# Patient Record
Sex: Male | Born: 1970 | Race: White | Hispanic: No | Marital: Single | Smoking: Former smoker
Health system: Southern US, Community
[De-identification: ages and names within clinical notes are randomized; demographics above are authoritative.]

## PROBLEM LIST (undated history)

## (undated) DIAGNOSIS — E785 Hyperlipidemia, unspecified: Secondary | ICD-10-CM

## (undated) DIAGNOSIS — M109 Gout, unspecified: Secondary | ICD-10-CM

## (undated) DIAGNOSIS — T7840XA Allergy, unspecified, initial encounter: Secondary | ICD-10-CM

## (undated) DIAGNOSIS — J439 Emphysema, unspecified: Secondary | ICD-10-CM

## (undated) HISTORY — PX: CHOLECYSTECTOMY: SHX55

## (undated) HISTORY — DX: Allergy, unspecified, initial encounter: T78.40XA

---

## 2016-11-09 ENCOUNTER — Telehealth: Payer: Self-pay | Admitting: Family Medicine

## 2016-11-09 ENCOUNTER — Emergency Department (HOSPITAL_COMMUNITY)
Admission: EM | Admit: 2016-11-09 | Discharge: 2016-11-09 | Disposition: A | Discharge status: Home / Self Care | Payer: 59 | Attending: Emergency Medicine | Admitting: Emergency Medicine

## 2016-11-09 ENCOUNTER — Emergency Department (HOSPITAL_COMMUNITY): Payer: 59

## 2016-11-09 ENCOUNTER — Emergency Department (HOSPITAL_COMMUNITY)
Admission: EM | Admit: 2016-11-09 | Discharge: 2016-11-09 | Disposition: A | Discharge status: Home / Self Care | Payer: Self-pay | Attending: Emergency Medicine | Admitting: Emergency Medicine

## 2016-11-09 ENCOUNTER — Ambulatory Visit (INDEPENDENT_AMBULATORY_CARE_PROVIDER_SITE_OTHER): Payer: Worker's Compensation

## 2016-11-09 ENCOUNTER — Encounter (HOSPITAL_COMMUNITY): Payer: Self-pay | Admitting: Emergency Medicine

## 2016-11-09 ENCOUNTER — Ambulatory Visit (INDEPENDENT_AMBULATORY_CARE_PROVIDER_SITE_OTHER): Payer: Worker's Compensation | Admitting: Urgent Care

## 2016-11-09 ENCOUNTER — Encounter: Payer: Self-pay | Admitting: Urgent Care

## 2016-11-09 VITALS — BP 118/84 | HR 87 | Temp 98.0°F | Resp 16 | Ht 72.0 in | Wt 230.0 lb

## 2016-11-09 DIAGNOSIS — M25572 Pain in left ankle and joints of left foot: Secondary | ICD-10-CM

## 2016-11-09 DIAGNOSIS — M79672 Pain in left foot: Secondary | ICD-10-CM

## 2016-11-09 DIAGNOSIS — F17210 Nicotine dependence, cigarettes, uncomplicated: Secondary | ICD-10-CM | POA: Insufficient documentation

## 2016-11-09 DIAGNOSIS — Z026 Encounter for examination for insurance purposes: Secondary | ICD-10-CM

## 2016-11-09 DIAGNOSIS — Z5321 Procedure and treatment not carried out due to patient leaving prior to being seen by health care provider: Secondary | ICD-10-CM | POA: Insufficient documentation

## 2016-11-09 DIAGNOSIS — M109 Gout, unspecified: Secondary | ICD-10-CM | POA: Insufficient documentation

## 2016-11-09 DIAGNOSIS — W1849XA Other slipping, tripping and stumbling without falling, initial encounter: Secondary | ICD-10-CM | POA: Insufficient documentation

## 2016-11-09 DIAGNOSIS — S99912A Unspecified injury of left ankle, initial encounter: Secondary | ICD-10-CM

## 2016-11-09 HISTORY — DX: Gout, unspecified: M10.9

## 2016-11-09 MED ORDER — PREDNISONE 20 MG PO TABS
60.0000 mg | ORAL_TABLET | Freq: Once | ORAL | Status: AC
  Administered 2016-11-09: 60 mg via ORAL
  Filled 2016-11-09: qty 3

## 2016-11-09 MED ORDER — IBUPROFEN 200 MG PO TABS: 600.0000 mg | ORAL_TABLET | Freq: Once | ORAL | Status: DC

## 2016-11-09 MED ORDER — HYDROCODONE-ACETAMINOPHEN 5-325 MG PO TABS
1.0000 | ORAL_TABLET | Freq: Once | ORAL | Status: AC
  Administered 2016-11-09: 1 via ORAL
  Filled 2016-11-09: qty 1

## 2016-11-09 MED ORDER — HYDROCODONE-ACETAMINOPHEN 5-325 MG PO TABS: 1.0000 | ORAL_TABLET | Freq: Four times a day (QID) | ORAL | 0 refills | Status: DC | PRN

## 2016-11-09 MED ORDER — PREDNISONE 20 MG PO TABS: 40.0000 mg | ORAL_TABLET | Freq: Every day | ORAL | 0 refills | Status: AC

## 2016-11-09 NOTE — Telephone Encounter (Signed)
Smith - Pt had a W/C Initial visit today with Urban GibsonMani.  He was very unhappy about the celebrex proposal and asked to not be scheduled with Marquita PalmsMario again.  I set the next appt. With English, but he wants to discuss his visit with an MD asap. [phone]

## 2016-11-09 NOTE — Discharge Instructions (Signed)
Please read and follow all provided instructions.  Your diagnoses today include:  1. Acute gout of right ankle, unspecified cause     Tests performed today include: Vital signs. See below for your results today.   Medications prescribed:  Take as prescribed   Home care instructions:  Follow any educational materials contained in this packet.  Follow-up instructions: Please follow-up with your primary care provider for further evaluation of symptoms and treatment   Return instructions:  Please return to the Emergency Department if you do not get better, if you get worse, or new symptoms OR  - Fever (temperature greater than 101.61F)  - Bleeding that does not stop with holding pressure to the area    -Severe pain (please note that you may be more sore the day after your accident)  - Chest Pain  - Difficulty breathing  - Severe nausea or vomiting  - Inability to tolerate food and liquids  - Passing out  - Skin becoming red around your wounds  - Change in mental status (confusion or lethargy)  - New numbness or weakness    Please return if you have any other emergent concerns.  Additional Information:  Your vital signs today were: BP 135/83 (BP Location: Left Arm)    Pulse (!) 102    Temp 98.6 F (37 C) (Oral)    Resp 16    Ht 6' (1.829 m)    Wt 104.3 kg (230 lb)    SpO2 95%    BMI 31.19 kg/m  If your blood pressure (BP) was elevated above 135/85 this visit, please have this repeated by your doctor within one month. ---------------

## 2016-11-09 NOTE — ED Provider Notes (Signed)
[facility]-EMERGENCY DEPT Provider Note   CSN: [id number] Arrival date & time: 11/09/16  1943     History   Chief Complaint Chief Complaint  Patient presents with  . Foot Pain    HPI Miguel Frost is a 46 y.o. male.  HPI 46 y.o. male presents to the Emergency Department today due to left foot pain. Pt states that he slipped on an oil spill at work. Notes pain getting worse. Pt went to UC initially today with negative Xrays and lab work. Lab work was testing for gout. Results not back yet. Attempted OTC relief with Alleve and elevation for ankle sprain with minimal relief. Notes pain is similar in past with Gout flare. Rates pain 10/10. Worse with ROM. No numbness/tingling. No swelling. No erythema. No fevers. No other symptoms noted   Past Medical History:  Diagnosis Date  . Allergy   . Gout   . Gout     There are no active problems to display for this patient.   Past Surgical History:  Procedure Laterality Date  . CHOLECYSTECTOMY         Home Medications    Prior to Admission medications   Not on File    Family History Family History  Problem Relation Age of Onset  . Diabetes Mother   . Hyperlipidemia Mother   . Hypertension Mother   . Diabetes Father   . Hyperlipidemia Father   . Hypertension Father   . Heart disease Maternal Grandmother   . Stroke Paternal Grandfather   . Heart disease Paternal Grandfather     Social History Social History  Substance Use Topics  . Smoking status: Current Every Day Smoker    Packs/day: 1.00    Types: Cigarettes  . Smokeless tobacco: Never Used  . Alcohol use 0.6 oz/week    1 Standard drinks or equivalent per week     Comment: minimal     Allergies   Avelox [moxifloxacin] and Zithromax [azithromycin]   Review of Systems Review of Systems ROS reviewed and all are negative for acute change except as noted in the HPI.  Physical Exam Updated Vital Signs BP 135/83 (BP Location: Left Arm)   Pulse (!) 102    Temp 98.6 F (37 C) (Oral)   Resp 16   Ht 6' (1.829 m)   Wt 104.3 kg (230 lb)   SpO2 95%   BMI 31.19 kg/m   Physical Exam  Constitutional: He is oriented to person, place, and time. Vital signs are normal. He appears well-developed and well-nourished.  HENT:  Head: Normocephalic and atraumatic.  Right Ear: Hearing normal.  Left Ear: Hearing normal.  Eyes: Pupils are equal, round, and reactive to light. Conjunctivae and EOM are normal.  Neck: Normal range of motion. Neck supple.  Cardiovascular: Normal rate and regular rhythm.   Pulmonary/Chest: Effort normal.  Musculoskeletal: Normal range of motion.  Right Ankle  Mild swelling to lateral aspect of ankle. Extreme TTP just grazing ankle on lateral aspect. Mild swelling. Pain with ROM. NVI. Motor/sensation intact.  Neurological: He is alert and oriented to person, place, and time.  Skin: Skin is warm and dry.  Psychiatric: He has a normal mood and affect. His speech is normal and behavior is normal. Thought content normal.  Nursing note and vitals reviewed.  ED Treatments / Results  Labs (all labs ordered are listed, but only abnormal results are displayed) Labs Reviewed - No data to display  EKG  EKG Interpretation None  Radiology Dg Ankle Complete Left  Result Date: 11/09/2016 CLINICAL DATA:  Left ankle pain. EXAM: LEFT ANKLE COMPLETE - 3+ VIEW COMPARISON:  None. FINDINGS: There is a tiny bony fragment adjacent to the medial malleolus likely reflecting sequela prior avulsive injury. There is no acute fracture or dislocation. There is a plantar calcaneal spur. There is no evidence of arthropathy or other focal bone abnormality. Soft tissues are unremarkable. IMPRESSION: No acute osseous injury of the left ankle. Electronically Signed   By: Elige Ko   On: 11/09/2016 12:25   Dg Foot Complete Left  Result Date: 11/09/2016 CLINICAL DATA:  Left foot pain for a few days.  No injury. EXAM: LEFT FOOT - COMPLETE 3+ VIEW  COMPARISON:  None. FINDINGS: Left foot appears intact. No evidence of acute fracture or subluxation. No focal bone lesion or bone destruction. Bone cortex and trabecular architecture appear intact. Plantar calcaneal spur. No radiopaque soft tissue foreign bodies. IMPRESSION: No acute bony abnormalities. Electronically Signed   By: Burman Nieves M.D.   On: 11/09/2016 01:13    Procedures Procedures (including critical care time)  Medications Ordered in ED Medications - No data to display   Initial Impression / Assessment and Plan / ED Course  I have reviewed the triage vital signs and the nursing notes.  Pertinent labs & imaging results that were available during my care of the patient were reviewed by me and considered in my medical decision making (see chart for details).  Final Clinical Impressions(s) / ED Diagnoses     {I have reviewed the relevant previous healthcare records.  {I obtained HPI from historian.   ED Course:  Assessment: Pt is a 46 y.o. male  presents to the Emergency Department today due to left foot pain. Pt states that he slipped on an oil spill at work. Notes pain getting worse. Pt went to UC initially today with negative Xrays and lab work. Lab work was testing for gout. Results not back yet. Attempted OTC relief with Alleve and elevation for ankle sprain with minimal relief. Notes pain is similar in past with Gout flare. Rates pain 10/10. Worse with ROM. No numbness/tingling. No swelling. No erythema. No fevers. On exam, pt in NAD. Nontoxic/nonseptic appearing. VSS. Afebrile. Mild swelling to lateral aspect of ankle. Extreme TTP just grazing ankle on lateral aspect. Mild swelling. Pain with ROM. NVI. Motor/sensation intact. Symptoms consistent with Gout Flare. Discussed that pt should respond to treatment with in 24 hour of begining treatment & likely resolve in 2-3 days. Given prednisone and Norco in ED. I have reviewed the [place] Controlled Substance Reporting  System.  Given Rx Norco and Prednisone. Plan is to DC Home with follow up to PCP. At time of discharge, Patient is in no acute distress. Vital Signs are stable. Patient is able to ambulate. Patient able to tolerate PO.   Disposition/Plan:  DC Home Additional Verbal discharge instructions given and discussed with patient.  Pt Instructed to f/u with PCP in the next week for evaluation and treatment of symptoms. Return precautions given Pt acknowledges and agrees with plan  Supervising Physician Vanetta Mulders, MD  Final diagnoses:  Acute gout of right ankle, unspecified cause    New Prescriptions New Prescriptions   No medications on file     Wilber Bihari 11/09/16 2152    Vanetta Mulders, MD 11/09/16 2207

## 2016-11-09 NOTE — Progress Notes (Signed)
MRN: [id number] DOB: 04/01/1970  Subjective:   Miguel Frost is a 46 y.o. male presenting for worker's comp visit.   Reports suffering an ankle injury while at work 11/07/2016. Patient slipped on an oil spot with his left foot, did not roll his ankle but stopped his fall by loading a lot of his weight on his foot. He had immediate pain at the distal plantar surface of his foot which improved throughout the night. However, he has since had progressive pain, swelling, redness of the left ankle. Has tried Motrin, APAP, icing with minimal relief. He has elevated his foot as well. He did have an x-ray completed of his left foot yesterday which was negative for an acute process.   Miguel Frost's medications list, allergies, past medical history and past surgical history were reviewed and excluded from this note due to being a worker's comp case.   Objective:   Vitals: BP 118/84   Pulse 87   Temp 98 F (36.7 C) (Oral)   Resp 16   Ht 6' (1.829 m) Comment: per pt  Wt 230 lb (104.3 kg) Comment: per pt  SpO2 100%   BMI 31.19 kg/m   Physical Exam  Constitutional: He is oriented to person, place, and time. He appears well-developed and well-nourished.  Cardiovascular: Normal rate.   Pulmonary/Chest: Effort normal.  Musculoskeletal:       Left ankle: He exhibits decreased range of motion (dorsiflexion, plantar flexion, inversion of foot) and swelling (medially with associated erythema). He exhibits no ecchymosis, no deformity, no laceration and normal pulse. Tenderness. Medial malleolus tenderness found. No lateral malleolus, no AITFL, no CF ligament, no posterior TFL, no head of 5th metatarsal and no proximal fibula tenderness found. Achilles tendon exhibits no pain and no defect.       Left foot: There is normal range of motion, no tenderness, no bony tenderness, no swelling, normal capillary refill, no crepitus, no deformity and no laceration.  Neurological: He is alert and oriented to person,  place, and time.   Dg Ankle Complete Left  Result Date: 11/09/2016 CLINICAL DATA:  Left ankle pain. EXAM: LEFT ANKLE COMPLETE - 3+ VIEW COMPARISON:  None. FINDINGS: There is a tiny bony fragment adjacent to the medial malleolus likely reflecting sequela prior avulsive injury. There is no acute fracture or dislocation. There is a plantar calcaneal spur. There is no evidence of arthropathy or other focal bone abnormality. Soft tissues are unremarkable. IMPRESSION: No acute osseous injury of the left ankle. Electronically Signed   By: Elige KoHetal  Patel   On: 11/09/2016 12:25   Dg Foot Complete Left  Result Date: 11/09/2016 CLINICAL DATA:  Left foot pain for a few days.  No injury. EXAM: LEFT FOOT - COMPLETE 3+ VIEW COMPARISON:  None. FINDINGS: Left foot appears intact. No evidence of acute fracture or subluxation. No focal bone lesion or bone destruction. Bone cortex and trabecular architecture appear intact. Plantar calcaneal spur. No radiopaque soft tissue foreign bodies. IMPRESSION: No acute bony abnormalities. Electronically Signed   By: Burman NievesWilliam  Stevens M.D.   On: 11/09/2016 01:13   Assessment and Plan :   1. Encounter related to worker's compensation claim 2. Left ankle injury, initial encounter 3. Acute left ankle pain 4. Left foot pain - Patient would like to wait on lab results prior to starting NSAID therapy. Will use RICE method, work restrictions. RTC in 1 week for follow up.  Miguel BambergMario Kiely Cousar, PA-C [facility] at [facility] [phone] 11/09/2016 12:01  PM

## 2016-11-09 NOTE — ED Notes (Signed)
Pt reports L foot pain for past few days, no injury. Hurts on the ball of the foot.

## 2016-11-09 NOTE — ED Notes (Signed)
Patient states he would rather go to urgent care tomorrow if we are still busy in the morning. He states his foot is not as bad off as most of the patients in the waiting room. Ambulatory with steady gait.

## 2016-11-09 NOTE — Patient Instructions (Addendum)
Rest your left foot, ankle. Continue to ice 20 minutes every 2 hours for the next 24 hours. Use an Ace wrap around your left ankle. We will be in touch about results and prescribed either Celebrex or colchicine.    IF you received an x-ray today, you will receive an invoice from [facility]. Please contact [facility] at [phone] with questions or concerns regarding your invoice.   IF you received labwork today, you will receive an invoice from [facility]. Please contact [facility] at [phone] with questions or concerns regarding your invoice.   Our billing staff will not be able to assist you with questions regarding bills from these companies.  You will be contacted with the lab results as soon as they are available. The fastest way to get your results is to activate your My Chart account. Instructions are located on the last page of this paperwork. If you have not heard from us regarding the results in 2 weeks, please contact this office.

## 2016-11-09 NOTE — ED Triage Notes (Signed)
Pt states he slipped in a oil spill at work on Tuesday and injured his left foot  Pt states since the pain has gotten worse  Pt went to urgent care today and had xrays and blood work performed  States the xray was unremarkable and the blood work they did to test for gout was not back yet  Pt's left foot is red   Pt states most of the pain is up around the ankle

## 2016-11-10 ENCOUNTER — Telehealth: Payer: Self-pay | Admitting: *Deleted

## 2016-11-10 LAB — URIC ACID: Uric Acid: 7.6 mg/dL (ref 3.7–8.6)

## 2016-11-10 NOTE — Telephone Encounter (Signed)
Left message to return call 

## 2016-11-13 NOTE — Telephone Encounter (Signed)
PT tried to return your call.  His number is [phone]

## 2016-11-16 ENCOUNTER — Ambulatory Visit (INDEPENDENT_AMBULATORY_CARE_PROVIDER_SITE_OTHER): Payer: Worker's Compensation | Admitting: Physician Assistant

## 2016-11-16 ENCOUNTER — Encounter: Payer: Self-pay | Admitting: Physician Assistant

## 2016-11-16 VITALS — BP 120/83 | HR 63 | Temp 98.2°F | Resp 16 | Ht 72.0 in | Wt 231.0 lb

## 2016-11-16 DIAGNOSIS — S99912D Unspecified injury of left ankle, subsequent encounter: Secondary | ICD-10-CM | POA: Diagnosis not present

## 2016-11-16 DIAGNOSIS — Z09 Encounter for follow-up examination after completed treatment for conditions other than malignant neoplasm: Secondary | ICD-10-CM

## 2016-11-16 DIAGNOSIS — Z026 Encounter for examination for insurance purposes: Secondary | ICD-10-CM

## 2016-11-16 NOTE — Patient Instructions (Addendum)
Take the naproxen as needed.  Or your can take ibuprofen with food (600 mg every 6 hour), but not consistently past 24 hours.   Perform these stretches three times per day.  Pick three pictures.  You will then ice immediately after for about 15 minutes.   Ask your health care provider which exercises are safe for you. Do exercises exactly as told by your health care provider and adjust them as directed. It is normal to feel mild stretching, pulling, tightness, or discomfort as you do these exercises, but you should stop right away if you feel sudden pain or your pain gets worse.Do not begin these exercises until told by your health care provider. Stretching and range of motion exercises These exercises warm up your muscles and joints and improve the movement and flexibility of your lower leg and ankle. These exercises also help to relieve pain and stiffness. Exercise A: Gastroc and soleus stretch  1. Sit on the floor with your left / right leg extended. 2. Loop a belt or towel around the ball of your left / right foot. The ball of your foot is on the walking surface, right under your toes. 3. Keep your left / right ankle and foot relaxed and keep your knee straight while you use the belt or towel to pull your foot toward you. You should feel a gentle stretch behind your calf or knee. 4. Hold this position for __________ seconds, then release to the starting position. Repeat the exercise with your knee bent. You can put a pillow or a rolled bath towel under your knee to support it. You should feel a stretch deep in your calf or at your Achilles tendon. Repeat each stretch __________ times. Complete these stretches __________ times a day. Exercise B: Ankle alphabet  1. Sit with your left / right leg supported at the lower leg. ? Do not rest your foot on anything. ? Make sure your foot has room to move freely. 2. Think of your left / right foot as a paintbrush, and move your foot to trace each  letter of the alphabet in the air. Keep your hip and knee still while you trace. Make the letters as large as you can without feeling discomfort. 3. Trace every letter from A to Z. Repeat __________ times. Complete this exercise __________ times a day. Strengthening exercises These exercises build strength and endurance in your ankle and lower leg. Endurance is the ability to use your muscles for a long time, even after they get tired. Exercise C: Dorsiflexors  1. Secure a rubber exercise band or tube to an object, such as a table leg, that will stay still when the band is pulled. Secure the other end around your left / right foot. 2. Sit on the floor facing the object, with your left / right leg extended. The band or tube should be slightly tense when your foot is relaxed. 3. Slowly bring your foot toward you, pulling the band tighter. 4. Hold this position for __________ seconds. 5. Slowly return your foot to the starting position. Repeat __________ times. Complete this exercise __________ times a day. Exercise D: Plantar flexors  1. Sit on the floor with your left / right leg extended. 2. Loop a rubber exercise tube or band around the ball of your left / right foot. The ball of your foot is on the walking surface, right under your toes. ? Hold the ends of the band or tube in your hands. ? The  band or tube should be slightly tense when your foot is relaxed. 3. Slowly point your foot and toes downward, pushing them away from you. 4. Hold this position for __________ seconds. 5. Slowly return your foot to the starting position. Repeat __________ times. Complete this exercise __________ times a day. Exercise E: Evertors 1. Sit on the floor with your legs straight out in front of you. 2. Loop a rubber exercise band or tube around the ball of your left / right foot. The ball of your foot is on the walking surface, right under your toes. ? Hold the ends of the band in your hands, or secure the  band to a stable object. ? The band or tube should be slightly tense when your foot is relaxed. 3. Slowly push your foot outward, away from your other leg. 4. Hold this position for __________ seconds. 5. Slowly return your foot to the starting position. Repeat __________ times. Complete this exercise __________ times a day. This information is not intended to replace advice given to you by your health care provider. Make sure you discuss any questions you have with your health care provider. Document Released: 09/21/2004 Document Revised: 10/28/2015 Document Reviewed: 01/04/2015 Elsevier Interactive Patient Education  2018 [employer]     IF you received an x-ray today, you will receive an invoice from [facility]. Please contact [facility] at [phone] with questions or concerns regarding your invoice.   IF you received labwork today, you will receive an invoice from [facility]. Please contact [facility] at [phone] with questions or concerns regarding your invoice.   Our billing staff will not be able to assist you with questions regarding bills from these companies.  You will be contacted with the lab results as soon as they are available. The fastest way to get your results is to activate your My Chart account. Instructions are located on the last page of this paperwork. If you have not heard from us regarding the results in 2 weeks, please contact this office.

## 2016-11-16 NOTE — Progress Notes (Signed)
[facility] AT [facility] [address], [place] [phone]  Date:  11/16/2016   Name:  Miguel DuffJonathan Frost   DOB:  08/05/1970   MRN:  [id number]  PCP:  Patient, No Pcp Per    History of Present Illness:  Miguel Frost is a 46 y.o. male patient who presents to PCP with  Chief Complaint  Patient presents with  . Follow-up    ankle injury/ WC. pt feels better     Date of injury: 11/07/2016 Patient was seen 7 days ago for left ankle and foot pain after sliding in oil spilled at work.  He attempted to go to the ED initially, but after waiting long hours, came to [facility].  Diagnosed with left ankle pain and negative for acute changes.  Possibly gout.  Uric acid level obtained at 7.6  There are no active problems to display for this patient.   Past Medical History:  Diagnosis Date  . Allergy   . Gout   . Gout     Past Surgical History:  Procedure Laterality Date  . CHOLECYSTECTOMY      Allergies  Allergen Reactions  . Avelox [Moxifloxacin] Other (See Comments)    Severe headache  . Zithromax [Azithromycin] Rash    Medication list has been reviewed and updated.  Current Outpatient Prescriptions on File Prior to Visit  Medication Sig Dispense Refill  . HYDROcodone-acetaminophen (NORCO/VICODIN) 5-325 MG tablet Take 1 tablet by mouth every 6 (six) hours as needed. 10 tablet 0  . predniSONE (DELTASONE) 20 MG tablet Take 2 tablets (40 mg total) by mouth daily. (Patient not taking: Reported on 11/16/2016) 14 tablet 0   No current facility-administered medications on file prior to visit.     ROS ROS otherwise unremarkable unless listed above.  Physical Examination: BP 120/83   Pulse 63   Temp 98.2 F (36.8 C) (Oral)   Resp 16   Ht 6' (1.829 m)   Wt 231 lb (104.8 kg)   SpO2 96%   BMI 31.33 kg/m  Ideal Body Weight: Weight in (lb) to have BMI = 25: 183.9  Physical Exam  Constitutional: He is oriented to person, place, and time. He appears  well-developed and well-nourished. No distress.  HENT:  Head: Normocephalic and atraumatic.  Eyes: Pupils are equal, round, and reactive to light. Conjunctivae and EOM are normal.  Cardiovascular: Normal rate.   Pulmonary/Chest: Effort normal. No respiratory distress.  Musculoskeletal:  Right medial lower leg with some tenderness just proximal to the malleolus.  No erythema.  Appropriate rom and strength throughout lower extremity.  Normal pulses.  Neurological: He is alert and oriented to person, place, and time.  Skin: Skin is warm and dry. He is not diaphoretic.  Psychiatric: He has a normal mood and affect. His behavior is normal.     Assessment and Plan: Miguel Frost is a 46 y.o. male who is here today for follow up of right foot injury. This appears to be improving. I am placing light work restrictions for heavy ambulation (see note) Advised ice and rom stretches preceding. rtc in 5 days.  Left ankle injury, subsequent encounter  Encounter related to worker's compensation claim  Follow up  Trena PlattStephanie Sakoya Win, PA-C [facility] 9/14/20188:55 AM

## 2016-11-20 NOTE — Telephone Encounter (Signed)
Pt seen on 9/13

## 2016-11-21 ENCOUNTER — Ambulatory Visit (INDEPENDENT_AMBULATORY_CARE_PROVIDER_SITE_OTHER): Payer: Worker's Compensation | Admitting: Physician Assistant

## 2016-11-21 ENCOUNTER — Encounter: Payer: Self-pay | Admitting: Physician Assistant

## 2016-11-21 VITALS — BP 144/97 | HR 83 | Temp 98.7°F | Resp 17 | Ht 73.5 in | Wt 231.0 lb

## 2016-11-21 DIAGNOSIS — S99912D Unspecified injury of left ankle, subsequent encounter: Secondary | ICD-10-CM

## 2016-11-21 DIAGNOSIS — Z026 Encounter for examination for insurance purposes: Secondary | ICD-10-CM
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
## 2016-11-21 NOTE — Patient Instructions (Signed)
     IF you received an x-ray today, you will receive an invoice from [facility]. Please contact [facility] at [phone] with questions or concerns regarding your invoice.   IF you received labwork today, you will receive an invoice from [facility]. Please contact [facility] at [phone] with questions or concerns regarding your invoice.   Our billing staff will not be able to assist you with questions regarding bills from these companies.  You will be contacted with the lab results as soon as they are available. The fastest way to get your results is to activate your My Chart account. Instructions are located on the last page of this paperwork. If you have not heard from us regarding the results in 2 weeks, please contact this office.     

## 2017-01-11 NOTE — Telephone Encounter (Signed)
error 

## 2017-01-18 ENCOUNTER — Emergency Department (HOSPITAL_COMMUNITY)
Admission: EM | Admit: 2017-01-18 | Discharge: 2017-01-18 | Disposition: A | Discharge status: Home / Self Care | Payer: Self-pay | Attending: Emergency Medicine | Admitting: Emergency Medicine

## 2017-01-18 ENCOUNTER — Encounter (HOSPITAL_COMMUNITY): Payer: Self-pay | Admitting: Emergency Medicine

## 2017-01-18 ENCOUNTER — Emergency Department (HOSPITAL_COMMUNITY): Payer: Self-pay

## 2017-01-18 DIAGNOSIS — F17210 Nicotine dependence, cigarettes, uncomplicated: Secondary | ICD-10-CM | POA: Insufficient documentation

## 2017-01-18 DIAGNOSIS — Y929 Unspecified place or not applicable: Secondary | ICD-10-CM | POA: Insufficient documentation

## 2017-01-18 DIAGNOSIS — S8991XA Unspecified injury of right lower leg, initial encounter: Secondary | ICD-10-CM | POA: Insufficient documentation

## 2017-01-18 DIAGNOSIS — Y998 Other external cause status: Secondary | ICD-10-CM | POA: Insufficient documentation

## 2017-01-18 DIAGNOSIS — Y33XXXA Other specified events, undetermined intent, initial encounter: Secondary | ICD-10-CM | POA: Insufficient documentation

## 2017-01-18 DIAGNOSIS — Y9383 Activity, rough housing and horseplay: Secondary | ICD-10-CM | POA: Insufficient documentation

## 2017-01-18 MED ORDER — HYDROCODONE-ACETAMINOPHEN 5-325 MG PO TABS
1.0000 | ORAL_TABLET | Freq: Once | ORAL | Status: AC
  Administered 2017-01-18: 1 via ORAL
  Filled 2017-01-18: qty 1

## 2017-01-18 MED ORDER — ACETAMINOPHEN 325 MG PO TABS
650.0000 mg | ORAL_TABLET | Freq: Once | ORAL | Status: AC
  Administered 2017-01-18: 650 mg via ORAL
  Filled 2017-01-18: qty 2

## 2017-01-18 NOTE — Discharge Instructions (Signed)
There is small amount of fluid ("effusion") on your x-ray. This may be an inflammatory response to some soft tissue injury.  There were no bony abnormalities, fractures or dislocations.  Please continue taking your medications as prescribed by your primary care provider.  You can take 600 mg ibuprofen + 1000 mg tylenol every 8 hours. Use your vicodin as prescribed by your primary care doctor for break through pain. Note that vicodin is a controlled substance and opioid and any refills should be obtained by the original provider who prescribed them to you.  Continue wearing your brace. Schedule an appointment with orthopedist for further evaluation if you pain persists. You may need an MRI, follow up with your primary care provider for medication refills as needed.   EXAM:  RIGHT KNEE - COMPLETE 4+ VIEW     COMPARISON:  None in PACs     FINDINGS:  The bones are subjectively adequately mineralized. There is no acute  fracture nor dislocation. There is mild beaking of the tibial  spines. There is a small suprapatellar effusion. There are no  abnormal soft tissue calcifications.     IMPRESSION:  Mild degenerative spurring of the tibial spines. No acute fracture  nor dislocation.        Electronically Signed    By: David  SwazilandJordan M.D.    On: 01/18/2017 12:25

## 2017-01-18 NOTE — ED Triage Notes (Signed)
Patient reports that he was wrestling Saturday with buddy and hurt his right knee. Patient reports went to doctor on Monday due to pain and swelling, was told to wear brace and taking steroids. Patient reports that pain is still persistent and cant bend knee more than 90 degrees. Pain now radiating up and down right leg from knee.

## 2017-01-18 NOTE — ED Provider Notes (Signed)
[facility]-EMERGENCY DEPT Provider Note   CSN: [id number] Arrival date & time: 01/18/17  1135     History   Chief Complaint Chief Complaint  Patient presents with  . Knee Injury    HPI Miguel Frost is a 46 y.o. male presents to the ED for evaluation of gradually worsening right knee pain worse above the patella x 5 days. States he had been drinking alcohol and was wrestling his friend on the ground when he felt his right knee twist. Denies hearing pops or immediate pain. He went to see his doctor who gave his steroids, NSAIDs and knee brace. Pain initially improved but last night he felt his knee was stuck and he couldn't fully flex or extend it. Swelling has improved. Pain is worse with full extension, flexion and palpation above the patella. Alleviated slightly with NSAIDs and steroids. No previous h/o injuries or surgeries to knee. No fevers, chills, skin color changes, numbness or tingling distally. No h/o gout or IVDU.   HPI  Past Medical History:  Diagnosis Date  . Allergy   . Gout   . Gout     There are no active problems to display for this patient.   Past Surgical History:  Procedure Laterality Date  . CHOLECYSTECTOMY         Home Medications    Prior to Admission medications   Medication Sig Start Date End Date Taking? Authorizing Provider  HYDROcodone-acetaminophen (NORCO/VICODIN) 5-325 MG tablet Take 1 tablet by mouth every 6 (six) hours as needed. Patient not taking: Reported on 11/21/2016 11/09/16   Audry PiliMohr, Tyler, PA-C    Family History Family History  Problem Relation Age of Onset  . Diabetes Mother   . Hyperlipidemia Mother   . Hypertension Mother   . Diabetes Father   . Hyperlipidemia Father   . Hypertension Father   . Heart disease Maternal Grandmother   . Stroke Paternal Grandfather   . Heart disease Paternal Grandfather     Social History Social History   Tobacco Use  . Smoking status: Current Every Day Smoker    Packs/day: 1.00    Years: 27.00    Pack years: 27.00    Types: Cigarettes  . Smokeless tobacco: Never Used  Substance Use Topics  . Alcohol use: Yes    Alcohol/week: 0.6 oz    Types: 1 Standard drinks or equivalent per week    Comment: minimal  . Drug use: No     Allergies   Avelox [moxifloxacin] and Zithromax [azithromycin]   Review of Systems Review of Systems  Musculoskeletal: Positive for arthralgias.  All other systems reviewed and are negative.    Physical Exam Updated Vital Signs BP (!) 152/104 (BP Location: Right Arm)   Pulse 90   Temp 98.6 F (37 C) (Oral)   Resp 18   SpO2 98%   Physical Exam  Constitutional: He is oriented to person, place, and time. He appears well-developed and well-nourished. No distress.  NAD.  HENT:  Head: Normocephalic and atraumatic.  Right Ear: External ear normal.  Left Ear: External ear normal.  Nose: Nose normal.  Eyes: Conjunctivae and EOM are normal. No scleral icterus.  Neck: Normal range of motion.  Cardiovascular: Normal rate, regular rhythm, normal heart sounds and intact distal pulses.  Pulses:      Popliteal pulses are 2+ on the right side, and 2+ on the left side.       Dorsalis pedis pulses are 2+ on the right side,  and 2+ on the left side.  Pulmonary/Chest: Effort normal and breath sounds normal.  Musculoskeletal: Normal range of motion. He exhibits tenderness. He exhibits no deformity.       Right knee: Tenderness found.  Focal tenderness above right patella and popliteal fossa Decreased AROM of right knee due to pain.  No right knee edema, erythema or warmth.  No skin color changes to right knee.  No laxity.  No tenderness to medial/lateral joint lines or patellar tendon.  No obvious deformity of knees including edema, erythema or effusion.  Full passive ROM of knees bilaterally with normal patellar J tracking bilaterally.  No medial or lateral joint line tenderness.   No bony tenderness over patella,  fibular head or tibial tuberosity.   Negative Lachman's. Negative posterior drawer test.   No crepitus with knee ROM.   Neurological: He is alert and oriented to person, place, and time.  Skin: Skin is warm and dry. Capillary refill takes less than 2 seconds.  Psychiatric: He has a normal mood and affect. His behavior is normal. Judgment and thought content normal.  Nursing note and vitals reviewed.    ED Treatments / Results  Labs (all labs ordered are listed, but only abnormal results are displayed) Labs Reviewed - No data to display  EKG  EKG Interpretation None       Radiology Dg Knee Complete 4 Views Right  Result Date: 01/18/2017 CLINICAL DATA:  Twisting injury of the knee while wrestling with a friend 5 days ago. Increasing pain over the anterior aspect of the knee with decreased range of motion. EXAM: RIGHT KNEE - COMPLETE 4+ VIEW COMPARISON:  None in PACs FINDINGS: The bones are subjectively adequately mineralized. There is no acute fracture nor dislocation. There is mild beaking of the tibial spines. There is a small suprapatellar effusion. There are no abnormal soft tissue calcifications. IMPRESSION: Mild degenerative spurring of the tibial spines. No acute fracture nor dislocation. Electronically Signed   By: David  [place]azil[place]Jordan M.D.   On: 01/18/2017 12:25    Procedures Procedures (including critical care time)  Medications Ordered in ED Medications  HYDROcodone-acetaminophen (NORCO/VICODIN) 5-325 MG per tablet 1 tablet (not administered)  acetaminophen (TYLENOL) tablet 650 mg (not administered)     Initial Impression / Assessment and Plan / ED Course  I have reviewed the triage vital signs and the nursing notes.  Pertinent labs & imaging results that were available during my care of the patient were reviewed by me and considered in my medical decision making (see chart for details).    46 yo presents with right knee pain after mechanical injury. No evidence of  septic arthritis or gout on exam. Symptoms have slightly improved with steroids, NSAIDs and brace prescribed by his PCP who saw him for same 5 days ago. X-ray today shows small suprapatellar effusion, where pt has the most pain.  Suspect soft tissue injury. Will continue conservative tx and refer to ortho. He has vicodin rx by PCP. Will encourage NSAIDs and defer refill of vicodin in the ED today.   Final Clinical Impressions(s) / ED Diagnoses   Final diagnoses:  Injury of right knee, initial encounter    ED Discharge Orders    None       Liberty HandyGibbons, Chawn Spraggins J, PA-C 01/18/17 1257    Cardama, Amadeo GarnetPedro Eduardo, MD 01/23/17 2356

## 2017-06-13 ENCOUNTER — Encounter: Payer: Self-pay | Admitting: Physician Assistant

## 2017-07-26 ENCOUNTER — Encounter: Payer: Self-pay | Admitting: Family Medicine

## 2018-01-29 ENCOUNTER — Emergency Department (HOSPITAL_COMMUNITY): Payer: PRIVATE HEALTH INSURANCE

## 2018-01-29 ENCOUNTER — Encounter (HOSPITAL_COMMUNITY): Payer: Self-pay | Admitting: Emergency Medicine

## 2018-01-29 ENCOUNTER — Other Ambulatory Visit: Payer: Self-pay

## 2018-01-29 ENCOUNTER — Emergency Department (HOSPITAL_COMMUNITY)
Admission: EM | Admit: 2018-01-29 | Discharge: 2018-01-29 | Disposition: A | Discharge status: Home / Self Care | Payer: PRIVATE HEALTH INSURANCE | Attending: Emergency Medicine | Admitting: Emergency Medicine

## 2018-01-29 DIAGNOSIS — F17210 Nicotine dependence, cigarettes, uncomplicated: Secondary | ICD-10-CM | POA: Diagnosis not present

## 2018-01-29 DIAGNOSIS — R0789 Other chest pain: Secondary | ICD-10-CM | POA: Insufficient documentation

## 2018-01-29 DIAGNOSIS — R079 Chest pain, unspecified: Secondary | ICD-10-CM

## 2018-01-29 LAB — CBC WITH DIFFERENTIAL/PLATELET
Abs Immature Granulocytes: 0.03 10*3/uL (ref 0.00–0.07)
BASOS ABS: 0 10*3/uL (ref 0.0–0.1)
Basophils Relative: 1 %
EOS ABS: 0.3 10*3/uL (ref 0.0–0.5)
EOS PCT: 4 %
HEMATOCRIT: 46.3 % (ref 39.0–52.0)
HEMOGLOBIN: 16.1 g/dL (ref 13.0–17.0)
IMMATURE GRANULOCYTES: 0 %
LYMPHS ABS: 2.2 10*3/uL (ref 0.7–4.0)
LYMPHS PCT: 31 %
MCH: 31 pg (ref 26.0–34.0)
MCHC: 34.8 g/dL (ref 30.0–36.0)
MCV: 89.2 fL (ref 80.0–100.0)
Monocytes Absolute: 0.6 10*3/uL (ref 0.1–1.0)
Monocytes Relative: 8 %
Neutro Abs: 4 10*3/uL (ref 1.7–7.7)
Neutrophils Relative %: 56 %
Platelets: 217 10*3/uL (ref 150–400)
RBC: 5.19 MIL/uL (ref 4.22–5.81)
RDW: 12 % (ref 11.5–15.5)
WBC: 7.1 10*3/uL (ref 4.0–10.5)
nRBC: 0 % (ref 0.0–0.2)

## 2018-01-29 LAB — BASIC METABOLIC PANEL
ANION GAP: 9 (ref 5–15)
BUN: 15 mg/dL (ref 6–20)
CHLORIDE: 109 mmol/L (ref 98–111)
CO2: 25 mmol/L (ref 22–32)
Calcium: 9 mg/dL (ref 8.9–10.3)
Creatinine, Ser: 1.16 mg/dL (ref 0.61–1.24)
GFR calc non Af Amer: >60 mL/min (ref >60)
Glucose, Bld: 122 mg/dL — ABNORMAL HIGH (ref 70–99)
Potassium: 3.9 mmol/L (ref 3.5–5.1)
SODIUM: 143 mmol/L (ref 135–145)

## 2018-01-29 LAB — I-STAT TROPONIN, ED
TROPONIN I, POC: 0 ng/mL (ref 0.00–0.08)
TROPONIN I, POC: 0 ng/mL (ref 0.00–0.08)

## 2018-01-29 MED ORDER — IOPAMIDOL (ISOVUE-370) INJECTION 76%
100.0000 mL | Freq: Once | INTRAVENOUS | Status: AC | PRN
  Administered 2018-01-29: 100 mL via INTRAVENOUS

## 2018-01-29 MED ORDER — HYDROCODONE-ACETAMINOPHEN 5-325 MG PO TABS: 1.0000 | ORAL_TABLET | Freq: Four times a day (QID) | ORAL | 0 refills | Status: DC | PRN

## 2018-01-29 MED ORDER — SODIUM CHLORIDE (PF) 0.9 % IJ SOLN
INTRAMUSCULAR | Status: AC
  Filled 2018-01-29: qty 50

## 2018-01-29 MED ORDER — IOPAMIDOL (ISOVUE-370) INJECTION 76%
INTRAVENOUS | Status: AC
  Filled 2018-01-29: qty 100

## 2018-01-29 MED ORDER — DIPHENHYDRAMINE HCL 50 MG/ML IJ SOLN
25.0000 mg | Freq: Once | INTRAMUSCULAR | Status: AC
  Administered 2018-01-29: 25 mg via INTRAVENOUS
  Filled 2018-01-29: qty 1

## 2018-01-29 MED ORDER — MORPHINE SULFATE (PF) 4 MG/ML IV SOLN
4.0000 mg | Freq: Once | INTRAVENOUS | Status: AC
  Administered 2018-01-29: 4 mg via INTRAVENOUS
  Filled 2018-01-29: qty 1

## 2018-01-29 MED ORDER — KETOROLAC TROMETHAMINE 30 MG/ML IJ SOLN
30.0000 mg | Freq: Once | INTRAMUSCULAR | Status: AC
  Administered 2018-01-29: 30 mg via INTRAVENOUS
  Filled 2018-01-29: qty 1

## 2018-01-29 MED ORDER — NAPROXEN 500 MG PO TABS: 500.0000 mg | ORAL_TABLET | Freq: Two times a day (BID) | ORAL | 0 refills | Status: DC

## 2018-01-29 NOTE — ED Provider Notes (Signed)
[facility]-EMERGENCY DEPT Provider Note   CSN: [id number] Arrival date & time: 01/29/18  8657     History   Chief Complaint Chief Complaint  Patient presents with  . Chest Pain    HPI Zayaan Kozak is a 47 y.o. male.  HPI  This is a 47 year old male with a history of gout and smoking who presents with chest pain.  Patient reports that he awoke from sleep this morning at around 2 AM with acute onset anterior chest pain and back pain.  He states he thought he pulled a muscle.  He took 4 ibuprofen and went back to sleep.  He did not have any associated shortness of breath or nausea.  Patient subsequently was awoken again around 4 AM with similar pain.  Pain is not worse with ambulation.  He does have some worsening of pain in certain positions.  Denies any worsening of pain with food.  Has not noted any weakness or numbness in his bilateral upper or lower extremities.  No abdominal pain, nausea, vomiting.  Currently he rates his pain at 6 out of 10.  He states that it comes and goes.  Past Medical History:  Diagnosis Date  . Allergy   . Gout   . Gout     There are no active problems to display for this patient.   Past Surgical History:  Procedure Laterality Date  . CHOLECYSTECTOMY          Home Medications    Prior to Admission medications   Medication Sig Start Date End Date Taking? Authorizing Provider  HYDROcodone-acetaminophen (NORCO/VICODIN) 5-325 MG tablet Take 1 tablet by mouth every 6 (six) hours as needed. Patient not taking: Reported on 11/21/2016 11/09/16   Audry Pili, PA-C    Family History Family History  Problem Relation Age of Onset  . Diabetes Mother   . Hyperlipidemia Mother   . Hypertension Mother   . Diabetes Father   . Hyperlipidemia Father   . Hypertension Father   . Heart disease Maternal Grandmother   . Stroke Paternal Grandfather   . Heart disease Paternal Grandfather     Social History Social History    Tobacco Use  . Smoking status: Current Every Day Smoker    Packs/day: 1.00    Years: 27.00    Pack years: 27.00    Types: Cigarettes  . Smokeless tobacco: Never Used  Substance Use Topics  . Alcohol use: Yes    Alcohol/week: 1.0 standard drinks    Types: 1 Standard drinks or equivalent per week    Comment: minimal  . Drug use: No     Allergies   Avelox [moxifloxacin] and Zithromax [azithromycin]   Review of Systems Review of Systems  Constitutional: Negative for fever.  Respiratory: Negative for cough and shortness of breath.   Cardiovascular: Positive for chest pain.  Gastrointestinal: Negative for abdominal pain, nausea and vomiting.  Musculoskeletal: Positive for back pain.  Neurological: Negative for weakness and numbness.  All other systems reviewed and are negative.    Physical Exam Updated Vital Signs BP (!) 134/97 (BP Location: Right Arm)   Pulse 90   Temp 98.6 F (37 C) (Oral)   Resp 15   Ht 1.829 m (6')   Wt 106.6 kg   SpO2 98%   BMI 31.87 kg/m   Physical Exam  Constitutional: He is oriented to person, place, and time. He appears well-developed and well-nourished. No distress.  HENT:  Head: Normocephalic and atraumatic.  Eyes: Pupils are equal, round, and reactive to light.  Neck: Neck supple.  Cardiovascular: Normal rate, regular rhythm, normal heart sounds and normal pulses.  No murmur heard. Pulmonary/Chest: Effort normal and breath sounds normal. No respiratory distress. He has no wheezes.  Abdominal: Soft. Bowel sounds are normal. There is no tenderness. There is no rebound.  Musculoskeletal: He exhibits no edema.       Right lower leg: He exhibits no tenderness and no edema.       Left lower leg: He exhibits no tenderness and no edema.  Lymphadenopathy:    He has no cervical adenopathy.  Neurological: He is alert and oriented to person, place, and time.  Skin: Skin is warm and dry.  Psychiatric: He has a normal mood and affect.   Nursing note and vitals reviewed.    ED Treatments / Results  Labs (all labs ordered are listed, but only abnormal results are displayed) Labs Reviewed  CBC WITH DIFFERENTIAL/PLATELET  BASIC METABOLIC PANEL  CBC  I-STAT TROPONIN, ED  I-STAT TROPONIN, ED    EKG EKG Interpretation  Date/Time:  Tuesday January 29 2018 05:23:08 EST Ventricular Rate:  91 PR Interval:    QRS Duration: 96 QT Interval:  358 QTC Calculation: 441 R Axis:   54 Text Interpretation:  Sinus rhythm Baseline wander in lead(s) V2 V3 Confirmed by Ross Marcus,  ([id number]) on 01/29/2018 5:33:23 AM   Radiology No results found.  Procedures Procedures (including critical care time)  Medications Ordered in ED Medications - No data to display   Initial Impression / Assessment and Plan / ED Course  I have reviewed the triage vital signs and the nursing notes.  Pertinent labs & imaging results that were available during my care of the patient were reviewed by me and considered in my medical decision making (see chart for details).     Patient presents with chest pain.  He is overall nontoxic-appearing vital signs are reassuring.  Only risk factor for ACS is smoking.  EKG shows no signs of ischemia.  Chest x-ray is negative.  Given description, there is some concern for possible dissection.  Musculoskeletal would also be in the differential.  Patient was given morphine.  Dissection study obtained.  7:16 AM Was informed by CT tech the patient developed some eye itching and swelling.  On my exam he has no signs or symptoms of anaphylaxis.  Mild eye itching and swelling.  He was given Benadryl.  No oropharyngeal swelling or shortness of breath.  Patient signed out pending CT scan and repeat troponin.  Final Clinical Impressions(s) / ED Diagnoses   Final diagnoses:  None    ED Discharge Orders    None       , Mayer Maskerourtney F, MD 01/29/18 [phone]

## 2018-01-29 NOTE — ED Notes (Signed)
ED Provider at bedside. 

## 2018-01-29 NOTE — Discharge Instructions (Addendum)
Naproxen as prescribed.  Hydrocodone is prescribed as needed for pain not relieved with naproxen.  Follow-up with your primary doctor if symptoms are not improving in the next 3 to 4 days, and return to the ER if symptoms significantly worsen or change.

## 2018-01-29 NOTE — ED Notes (Signed)
Patient transported to CT 

## 2018-01-29 NOTE — ED Provider Notes (Signed)
Care assumed from Dr. Wilkie AyeHorton at shift change.  Patient presents here with complaints of chest pain.  Initial troponin and EKG do not suggest a cardiac etiology.  Care was signed out to me awaiting CT scan of the chest to rule out dissection.  This was performed and is negative.  A second troponin was obtained and this was also negative.  Patient feeling somewhat better with medications given in the ER.  Upon reevaluation, he continues to complain of discomfort, however this is worse when he sits forward and moves.  It seems very musculoskeletal in nature.  He will be discharged with pain medication and follow-up as needed.   Geoffery Lyonselo, Emberlynn Riggan, MD 01/29/18 [phone]

## 2018-01-29 NOTE — ED Triage Notes (Signed)
Pt reports chest pain that started at 0200 and is sharp pain in substernal chest with radiation to back.
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
## 2018-09-03 ENCOUNTER — Encounter: Payer: Self-pay | Admitting: Orthopaedic Surgery

## 2018-09-03 ENCOUNTER — Other Ambulatory Visit: Payer: Self-pay

## 2018-09-03 ENCOUNTER — Ambulatory Visit (INDEPENDENT_AMBULATORY_CARE_PROVIDER_SITE_OTHER): Payer: PRIVATE HEALTH INSURANCE | Admitting: Orthopaedic Surgery

## 2018-09-03 ENCOUNTER — Ambulatory Visit (INDEPENDENT_AMBULATORY_CARE_PROVIDER_SITE_OTHER): Payer: PRIVATE HEALTH INSURANCE

## 2018-09-03 DIAGNOSIS — M25562 Pain in left knee: Secondary | ICD-10-CM

## 2018-09-03 MED ORDER — INDOMETHACIN 25 MG PO CAPS: 75.0000 mg | ORAL_CAPSULE | Freq: Two times a day (BID) | ORAL | 0 refills | Status: DC

## 2018-09-03 MED ORDER — PREDNISONE 10 MG (21) PO TBPK: ORAL_TABLET | ORAL | 0 refills | Status: DC

## 2018-09-03 NOTE — Addendum Note (Signed)
Addended by: Mal Misty L on: 09/03/2018 01:25 PM   Modules accepted: Orders

## 2018-09-03 NOTE — Progress Notes (Signed)
Office Visit Note   Patient: Miguel DuffJonathan Frost           Date of Birth: 11/20/1970           MRN: [id number] Visit Date: 09/03/2018              Requested by: Kaleen MaskElkins, Wilson Oliver, MD [address] [place],  [place] [id number] PCP: Kaleen MaskElkins, Wilson Oliver, MD   Assessment & Plan: Visit Diagnoses:  1. 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  Patient declined cortisone injection I would like to try prednisone Dosepak and indomethacin for now.  Rest and ice and elevate.  Out of work for couple days.  Patient will call if he has no better or if he changes his mind about the injection.  Uric acid level obtained today.  Follow-Up Instructions: Return if symptoms worsen or fail to improve.   Orders:  Orders Placed This Encounter  Procedures  . XR KNEE 3 VIEW LEFT   Meds ordered this encounter  Medications  . predniSONE (STERAPRED UNI-PAK 21 TAB) 10 MG (21) TBPK tablet    Sig: Take as directed    Dispense:  21 tablet    Refill:  0  . indomethacin (INDOCIN) 25 MG capsule    Sig: Take 3 capsules (75 mg total) by mouth 2 (two) times daily with a meal.    Dispense:  60 capsule    Refill:  0      Procedures: No procedures performed   Clinical Data: No additional findings.   Subjective: Chief Complaint  Patient presents with  . Left Knee - Pain    Christiane Frost is a 48 year old gentleman who comes in for acute onset of left knee pain since last night.  He does have a strong history of gout but has never had it in the left knee.  Denies any injuries.  Denies any mechanical symptoms.  Denies any constitutional symptoms.  Denies any numbness and tingling.  The pain is centered around the knee with movement.  Denies any ankle or hip pain.  He takes allopurinol and he took colchicine last night.   Review of Systems  Constitutional: Negative.   All other systems reviewed and are negative.    Objective: Vital  Signs: There were no vitals taken for this visit.  Physical Exam Vitals signs and nursing note reviewed.  Constitutional:      Appearance: He is well-developed.  HENT:     Head: Normocephalic and atraumatic.  Eyes:     Pupils: Pupils are equal, round, and reactive to light.  Neck:     Musculoskeletal: Neck supple.  Pulmonary:     Effort: Pulmonary effort is normal.  Abdominal:     Palpations: Abdomen is soft.  Musculoskeletal: Normal range of motion.  Skin:    General: Skin is warm.  Neurological:     Mental Status: He is alert and oriented to person, place, and time.  Psychiatric:        Behavior: Behavior normal.        Thought Content: Thought content normal.        Judgment: Judgment normal.     Ortho Exam Left knee exam shows a trace joint effusion.  No redness or warmth or signs of infection.  Painful range of motion.  Otherwise exam is unremarkable. Specialty Comments:  No specialty comments available.  Imaging: Xr Knee 3 View Left  Result  Date: 09/03/2018 No acute or structural abnormalities    PMFS History: There are no active problems to display for this patient.  Past Medical History:  Diagnosis Date  . Allergy   . Gout   . Gout     Family History  Problem Relation Age of Onset  . Diabetes Mother   . Hyperlipidemia Mother   . Hypertension Mother   . Diabetes Father   . Hyperlipidemia Father   . Hypertension Father   . Heart disease Maternal Grandmother   . Stroke Paternal Grandfather   . Heart disease Paternal Grandfather     Past Surgical History:  Procedure Laterality Date  . CHOLECYSTECTOMY     Social History   Occupational History  . Not on file  Tobacco Use  . Smoking status: Current Every Day Smoker    Packs/day: 1.00    Years: 27.00    Pack years: 27.00    Types: Cigarettes  . Smokeless tobacco: Never Used  Substance and Sexual Activity  . Alcohol use: Yes    Alcohol/week: 1.0 standard drinks    Types: 1 Standard drinks  or equivalent per week    Comment: minimal  . Drug use: No  . Sexual activity: Not on file

## 2018-09-04 LAB — URIC ACID: Uric Acid, Serum: 2.9 mg/dL — ABNORMAL LOW (ref 4.0–8.0)

## 2018-10-10 ENCOUNTER — Ambulatory Visit (INDEPENDENT_AMBULATORY_CARE_PROVIDER_SITE_OTHER): Payer: PRIVATE HEALTH INSURANCE

## 2018-10-10 ENCOUNTER — Ambulatory Visit (INDEPENDENT_AMBULATORY_CARE_PROVIDER_SITE_OTHER): Payer: PRIVATE HEALTH INSURANCE | Admitting: Orthopaedic Surgery

## 2018-10-10 ENCOUNTER — Encounter: Payer: Self-pay | Admitting: Orthopaedic Surgery

## 2018-10-10 DIAGNOSIS — M25561 Pain in right knee: Secondary | ICD-10-CM | POA: Diagnosis not present

## 2018-10-10 MED ORDER — METHYLPREDNISOLONE ACETATE 40 MG/ML IJ SUSP
40.0000 mg | INTRAMUSCULAR | Status: AC | PRN
  Administered 2018-10-10: 11:00:00 40 mg via INTRA_ARTICULAR

## 2018-10-10 MED ORDER — BUPIVACAINE HCL 0.25 % IJ SOLN
2.0000 mL | INTRAMUSCULAR | Status: AC | PRN
  Administered 2018-10-10: 11:00:00 2 mL via INTRA_ARTICULAR

## 2018-10-10 MED ORDER — LIDOCAINE HCL 1 % IJ SOLN
2.0000 mL | INTRAMUSCULAR | Status: AC | PRN
  Administered 2018-10-10: 2 mL

## 2018-10-10 NOTE — Progress Notes (Signed)
Office Visit Note   Patient: Miguel Frost           Date of Birth: Nov 07, 1970           MRN: [id number] Visit Date: 10/10/2018              Requested by: Miguel Downing, MD [place],  [place] [address] PCP: Miguel Downing, MD   Assessment & Plan: Visit Diagnoses:  1. Acute pain of right knee     Plan: Impression is right knee questionable gout attack.  Approximately 24 cc fluid was aspirated from the right knee.  This was sent off for cell count and crystals.  We will call him with the results.  I then injected this with cortisone.  Patient will touch base with [place] in 2 weeks to let us know how he is doing.  If he has is worsening symptoms over the weekend or next week he will call us and let us know.  Follow-Up Instructions: Return if symptoms worsen or fail to improve.   Orders:  Orders Placed This Encounter  Procedures  . Large Joint Inj: R knee  . XR Knee Complete 4 Views Right   No orders of the defined types were placed in this encounter.     Procedures: Large Joint Inj: R knee on 10/10/2018 11:10 AM Indications: pain Details: 22 G needle, anterolateral approach Medications: 2 mL bupivacaine 0.25 %; 2 mL lidocaine 1 %; 40 mg methylPREDNISolone acetate 40 MG/ML      Clinical Data: No additional findings.   Subjective: Chief Complaint  Patient presents with  . Right Knee - Pain    HPI patient is a pleasant 48 year old gentleman who presents our clinic today with right knee pain.  This began this past Monday afternoon.  No new injury or change in activity.  He does note an injury to the medial right knee in 2018 which improved with time.  No pain since then until Monday.  The pain he has is primarily to the medial aspect.  He denies any locking or catching but notes that he feels a lot of pressure and is unable to move his knee secondary to the pain.  He has noticed swelling and redness as well.  He has been taking Norco with mild  to no relief of symptoms.  He does have a history of 4 gouty attacks over the past 2 months.  On Monday, he also had gout attack to his right foot for which he took Colcrys.  This resolved.  No change in symptoms to the right knee.  He was seen in our office a few weeks ago with similar symptoms to the left knee.  Uric acid levels obtained which were below normal.  He was given a Sterapred taper which seemed to alleviated his symptoms.  Review of Systems as detailed in HPI.  All others reviewed and are negative.   Objective: Vital Signs: There were no vitals taken for this visit.  Physical Exam well-developed well-nourished gentleman in no acute distress.  Alert and oriented x3.  Ortho Exam examination of the right knee shows a trace to 1+ effusion.  Very limited range of motion secondary to pain.  He has mild erythema.  Moderate medial joint line tenderness.  Calf is soft nontender.  He is neurovascular intact distally.  Specialty Comments:  No specialty comments available.  Imaging: Xr Knee Complete 4 Views Right  Result Date: 10/10/2018 Mild medial and patellofemoral joint  space narrowing.  Otherwise, no acute or structural abnormality    PMFS History: There are no active problems to display for this patient.  Past Medical History:  Diagnosis Date  . Allergy   . Gout   . Gout     Family History  Problem Relation Age of Onset  . Diabetes Mother   . Hyperlipidemia Mother   . Hypertension Mother   . Diabetes Father   . Hyperlipidemia Father   . Hypertension Father   . Heart disease Maternal Grandmother   . Stroke Paternal Grandfather   . Heart disease Paternal Grandfather     Past Surgical History:  Procedure Laterality Date  . CHOLECYSTECTOMY     Social History   Occupational History  . Not on file  Tobacco Use  . Smoking status: Current Every Day Smoker    Packs/day: 1.00    Years: 27.00    Pack years: 27.00    Types: Cigarettes  . Smokeless tobacco: Never  Used  Substance and Sexual Activity  . Alcohol use: Yes    Alcohol/week: 1.0 standard drinks    Types: 1 Standard drinks or equivalent per week    Comment: minimal  . Drug use: No  . Sexual activity: Not on file

## 2018-10-11 LAB — SYNOVIAL CELL COUNT + DIFF, W/ CRYSTALS
Basophils, %: 0 %
Eosinophils-Synovial: 0 % (ref 0–2)
Lymphocytes-Synovial Fld: 53 % (ref 0–74)
Monocyte/Macrophage: 11 % (ref 0–69)
Neutrophil, Synovial: 36 % — ABNORMAL HIGH (ref 0–24)
Synoviocytes, %: 0 % (ref 0–15)
WBC, Synovial: 8707 cells/uL — ABNORMAL HIGH (ref <150)

## 2018-10-16 ENCOUNTER — Encounter: Payer: Self-pay | Admitting: Orthopaedic Surgery

## 2018-10-16 MED ORDER — PREDNISONE 5 MG (21) PO TBPK: ORAL_TABLET | ORAL | 0 refills | Status: DC

## 2018-10-16 NOTE — Telephone Encounter (Signed)
Ok do you mind sending a pred pak in please.  Thanks.

## 2018-10-16 NOTE — Telephone Encounter (Signed)
Let's get him a hinged knee brace to help with ambulation.  What prescription was she supposed to call in?

## 2018-10-17 ENCOUNTER — Encounter: Payer: Self-pay | Admitting: Orthopaedic Surgery

## 2019-11-03 IMAGING — CR DG CHEST 2V
2 series · 2 of 2 positions shown · non-contrast
Comparison: None.

CLINICAL DATA: Sudden onset of substernal chest pain.

EXAM:
CHEST - 2 VIEW

[w chest lat]
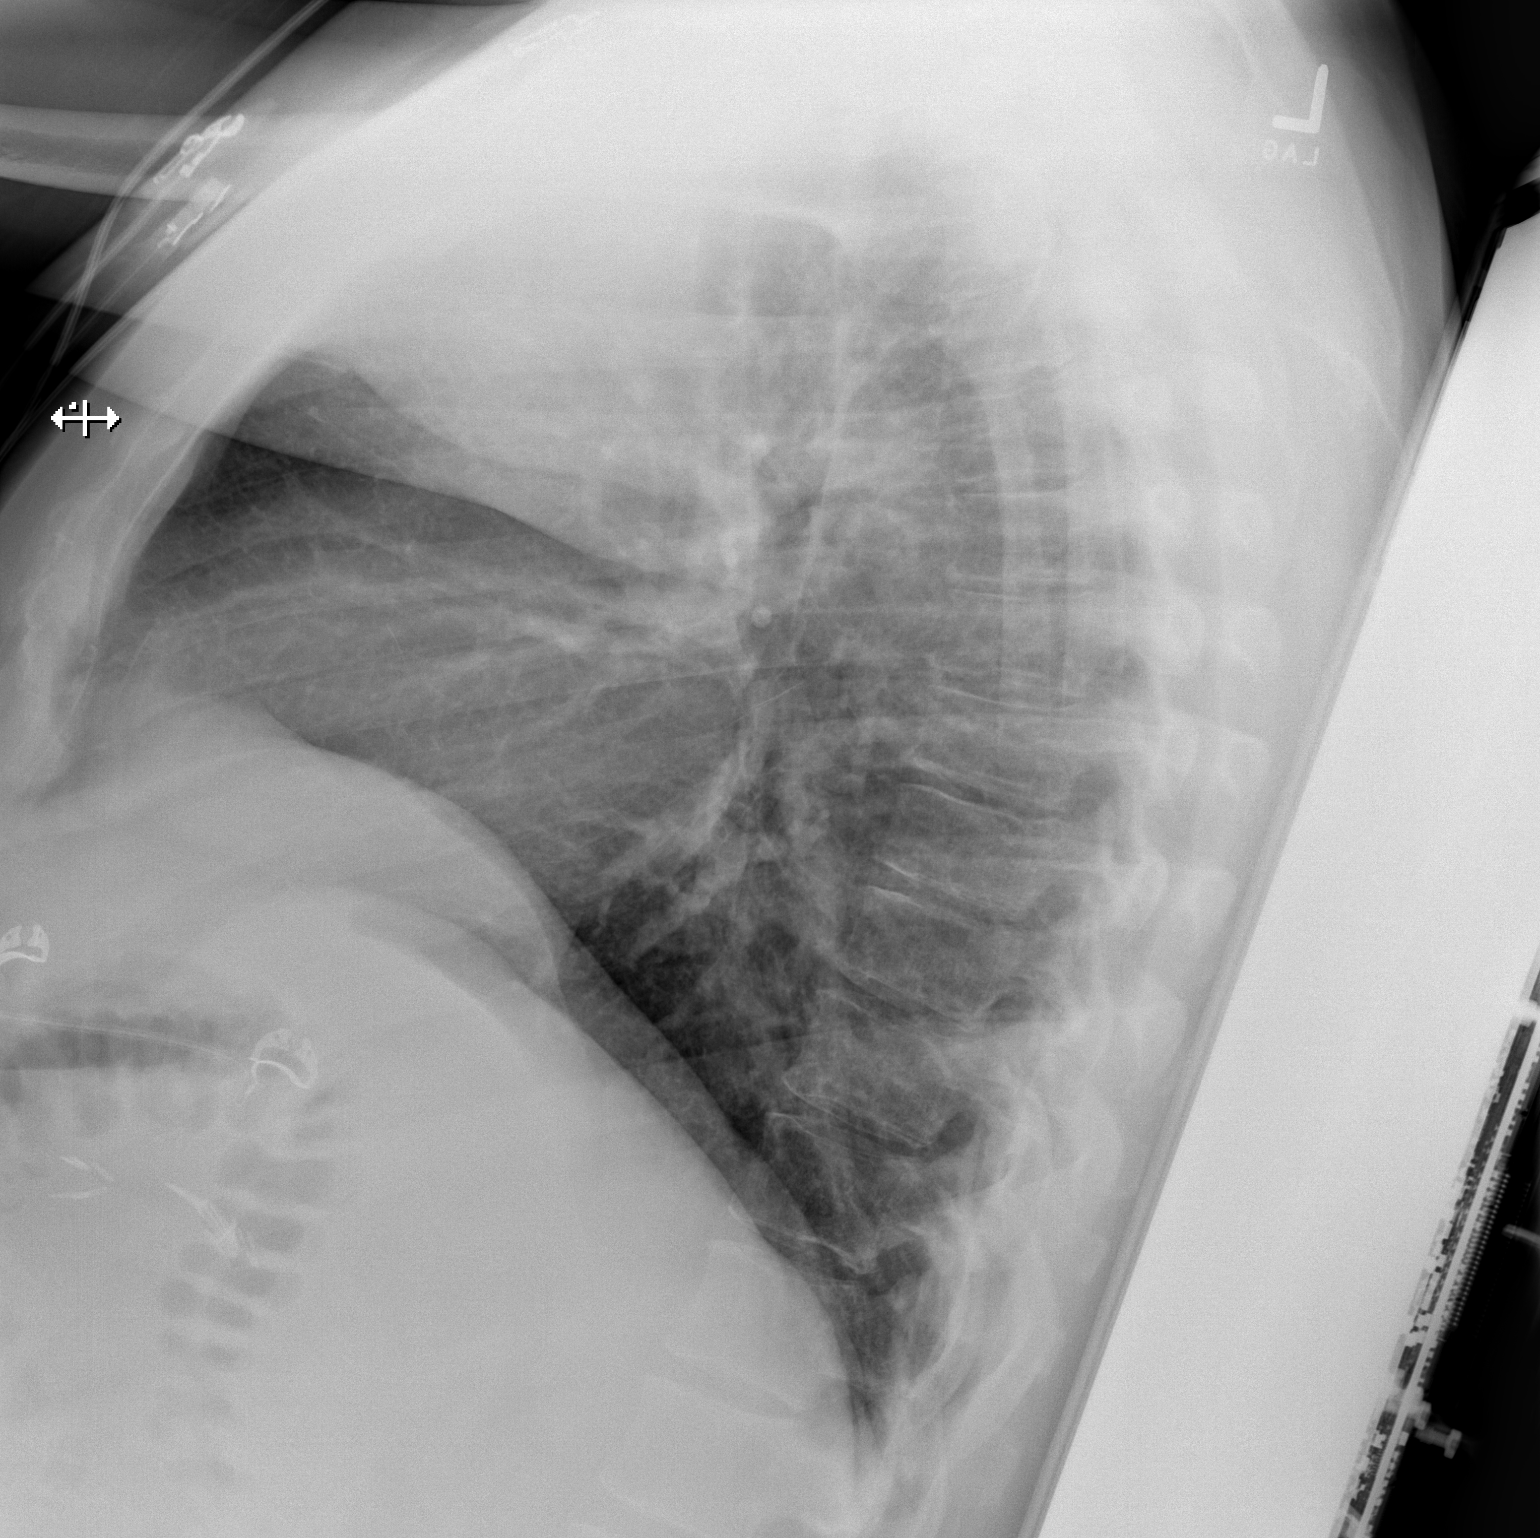

[x chest ap]
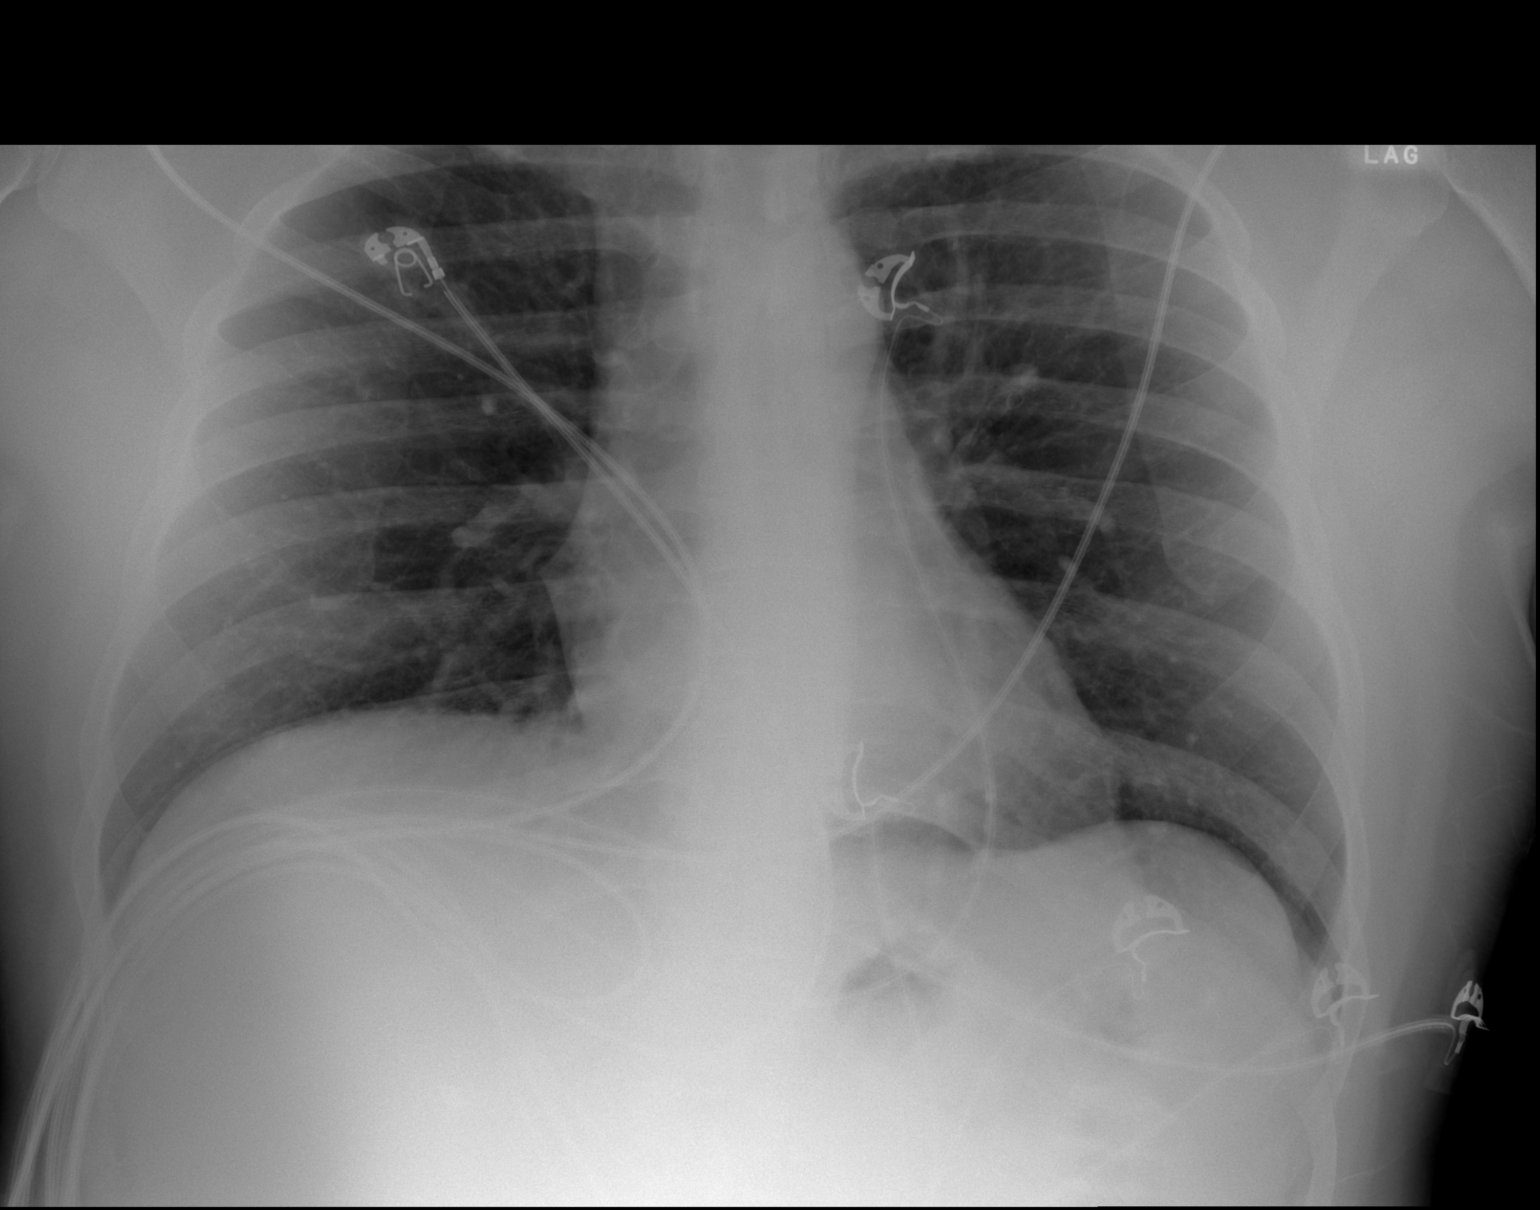

[2 of 2 positions shown; findings below may reference images not displayed]

FINDINGS: The cardiomediastinal contours are normal. The lungs are clear.
Pulmonary vasculature is normal. No consolidation, pleural effusion,
or pneumothorax. No acute osseous abnormalities are seen.
IMPRESSION: Unremarkable radiographs of the chest.

## 2020-03-29 ENCOUNTER — Other Ambulatory Visit: Payer: Self-pay | Admitting: Orthopedic Surgery

## 2020-03-29 DIAGNOSIS — M25831 Other specified joint disorders, right wrist: Secondary | ICD-10-CM

## 2020-03-30 ENCOUNTER — Other Ambulatory Visit: Payer: Self-pay

## 2020-03-30 ENCOUNTER — Telehealth: Payer: Self-pay

## 2020-03-30 MED ORDER — PREDNISONE 50 MG PO TABS: ORAL_TABLET | ORAL | 0 refills | Status: DC

## 2020-03-30 MED ORDER — DIPHENHYDRAMINE HCL 50 MG PO CAPS: 50.0000 mg | ORAL_CAPSULE | Freq: Once | ORAL | Status: AC

## 2020-03-30 NOTE — Telephone Encounter (Signed)
13 hour prep ordered for contrast allergy

## 2020-04-02 ENCOUNTER — Other Ambulatory Visit: Payer: Self-pay

## 2020-04-02 ENCOUNTER — Ambulatory Visit
Admission: RE | Admit: 2020-04-02 | Discharge: 2020-04-02 | Disposition: A | Discharge status: Home / Self Care | Payer: BLUE CROSS/BLUE SHIELD | Source: Ambulatory Visit | Attending: Orthopedic Surgery | Admitting: Orthopedic Surgery

## 2020-04-02 ENCOUNTER — Ambulatory Visit
Admission: RE | Admit: 2020-04-02 | Discharge: 2020-04-02 | Disposition: A | Discharge status: Home / Self Care | Payer: PRIVATE HEALTH INSURANCE | Source: Ambulatory Visit | Attending: Orthopedic Surgery | Admitting: Orthopedic Surgery

## 2020-04-02 DIAGNOSIS — M25831 Other specified joint disorders, right wrist: Secondary | ICD-10-CM

## 2020-04-02 MED ORDER — IOPAMIDOL (ISOVUE-M 200) INJECTION 41%
3.0000 mL | Freq: Once | INTRAMUSCULAR | Status: AC
  Administered 2020-04-02: 3 mL via INTRA_ARTICULAR

## 2020-05-17 ENCOUNTER — Other Ambulatory Visit: Payer: Self-pay | Admitting: Orthopedic Surgery

## 2020-05-17 DIAGNOSIS — M25831 Other specified joint disorders, right wrist: Secondary | ICD-10-CM

## 2020-05-27 ENCOUNTER — Other Ambulatory Visit: Payer: Self-pay | Admitting: Family Medicine

## 2020-05-27 DIAGNOSIS — F17200 Nicotine dependence, unspecified, uncomplicated: Secondary | ICD-10-CM

## 2020-06-09 ENCOUNTER — Other Ambulatory Visit: Payer: BLUE CROSS/BLUE SHIELD

## 2020-06-22 ENCOUNTER — Ambulatory Visit
Admission: RE | Admit: 2020-06-22 | Discharge: 2020-06-22 | Disposition: A | Discharge status: Home / Self Care | Payer: BLUE CROSS/BLUE SHIELD | Source: Ambulatory Visit | Attending: Family Medicine | Admitting: Family Medicine

## 2020-06-22 DIAGNOSIS — F17200 Nicotine dependence, unspecified, uncomplicated: Secondary | ICD-10-CM

## 2020-09-07 ENCOUNTER — Other Ambulatory Visit: Payer: Self-pay

## 2020-09-07 ENCOUNTER — Ambulatory Visit
Admission: EM | Admit: 2020-09-07 | Discharge: 2020-09-07 | Disposition: A | Discharge status: Home / Self Care | Payer: BLUE CROSS/BLUE SHIELD

## 2020-09-07 DIAGNOSIS — M5442 Lumbago with sciatica, left side: Secondary | ICD-10-CM

## 2020-09-07 DIAGNOSIS — M5441 Lumbago with sciatica, right side: Secondary | ICD-10-CM

## 2020-09-07 MED ORDER — PREDNISONE 20 MG PO TABS: 40.0000 mg | ORAL_TABLET | Freq: Every day | ORAL | 0 refills | Status: AC

## 2020-09-07 MED ORDER — KETOROLAC TROMETHAMINE 30 MG/ML IJ SOLN
30.0000 mg | Freq: Once | INTRAMUSCULAR | Status: AC
  Administered 2020-09-07: 30 mg via INTRAMUSCULAR

## 2020-09-07 NOTE — ED Triage Notes (Signed)
Dx with covid 2 weeks ago, notes he spent the majority of his time lying down causing a gradual onset of low back pain. (Over 1 week since the onset) Lumbar pain that radiates into the posterior aspect of his bilateral thighs and legs stopping before his ankles. Confirms intermittent numbness and tingling with the same distribution. Sxs have worsened since the onset. Lying down alleviates sxs. Standing, walking and bending aggravate sxs. Has been taking Motrin without relief.

## 2020-09-07 NOTE — ED Provider Notes (Signed)
[name]    CSN: [id number] Arrival date & time: 09/07/20  1112      History   Chief Complaint Chief Complaint  Patient presents with   Back Pain    Lumbar radiates to posterior  BLE.[name]    HPI Miguel Frost is a 50 y.o. male.   Patient presents to the urgent care with approximately 9-day history of bilateral lower back pain.  Patient reports that pain started after he had to lay in the bed for 5 days due to COVID diagnosis.  Denies any injury to back.  Pain does radiate down bilateral legs.  Has intermittent numbness and tingling.  Denies any dysuria, urinary frequency, urinary or bowel incontinence.  Patient states that he does have a history of sciatica approximately 10 years ago but no flareups since then.  Patient is prescribed Celebrex for right wrist pain that he is being managed and evaluated by other provider.  Patient stopped taking Celebrex approximately 1 week ago in order to take ibuprofen over-the-counter.  Has been taking ibuprofen with no relief of pain.  Has not used ice or heat application.  Patient rates pain 8 out of 10 on a 1-10 pain scale when moving.  Patient rates pain 4 out of 10 when sitting still.  Pain is worsened when bending over and when going from sitting to standing position per patient.   Back Pain  Past Medical History:  Diagnosis Date   Allergy    Gout    Gout     There are no problems to display for this patient.   Past Surgical History:  Procedure Laterality Date   CHOLECYSTECTOMY         Home Medications    Prior to Admission medications   Medication Sig Start Date End Date Taking? Authorizing Provider  celecoxib (CELEBREX) 200 MG capsule Take by mouth. 07/19/20  Yes [provider]  predniSONE (DELTASONE) 20 MG tablet Take 2 tablets (40 mg total) by mouth daily for 5 days. 09/07/20 09/12/20 Yes Lance Muss, FNP  allopurinol (ZYLOPRIM) 100 MG tablet Take 100 mg by mouth daily.    [provider]   atorvastatin (LIPITOR) 10 MG tablet Take 10 mg by mouth daily. 08/30/20   [provider]  colchicine 0.6 MG tablet Take 0.6 mg by mouth daily as needed (gout).    [provider]  HYDROcodone-acetaminophen (NORCO) 5-325 MG tablet Take 1-2 tablets by mouth every 6 (six) hours as needed. 01/29/18   Geoffery Lyons, MD  ibuprofen (ADVIL,MOTRIN) 200 MG tablet Take 800 mg by mouth every 6 (six) hours as needed for moderate pain.    [provider]  indomethacin (INDOCIN) 25 MG capsule Take 3 capsules (75 mg total) by mouth 2 (two) times daily with a meal. 09/03/18   Tarry Kos, MD  naproxen (NAPROSYN) 500 MG tablet Take 1 tablet (500 mg total) by mouth 2 (two) times daily. 01/29/18   Geoffery Lyons, MD    Family History Family History  Problem Relation Age of Onset   Diabetes Mother    Hyperlipidemia Mother    Hypertension Mother    Diabetes Father    Hyperlipidemia Father    Hypertension Father    Heart disease Maternal Grandmother    Stroke Paternal Grandfather    Heart disease Paternal Grandfather     Social History Social History   Tobacco Use   Smoking status: Former    Packs/day: 1.00    Years: 27.00  Pack years: 27.00    Types: Cigarettes    Quit date: 06/22/2020    Years since quitting: 0.2   Smokeless tobacco: Never  Vaping Use   Vaping Use: Every day  Substance Use Topics   Alcohol use: Yes    Alcohol/week: 1.0 standard drink    Types: 1 Standard drinks or equivalent per week    Comment: minimal   Drug use: No     Allergies   Isovue [iopamidol], Avelox [moxifloxacin], and Zithromax [azithromycin]   Review of Systems Review of Systems  Musculoskeletal:  Positive for back pain.  Per HPI  Physical Exam Triage Vital Signs ED Triage Vitals  Enc Vitals Group     BP 09/07/20 1223 (!) 151/103     Pulse Rate 09/07/20 1223 87     Resp 09/07/20 1223 18     Temp 09/07/20 1223 98.4 F (36.9 C)     Temp Source 09/07/20 1223 Oral      SpO2 09/07/20 1223 97 %     Weight --      Height --      Head Circumference --      Peak Flow --      Pain Score 09/07/20 1229 7     Pain Loc --      Pain Edu? --      Excl. in GC? --    No data found.  Updated Vital Signs BP (!) 149/103 (BP Location: Left Arm)   Pulse 90   Temp 98.3 F (36.8 C) (Oral)   Resp 16   SpO2 97%   Visual Acuity Right Eye Distance:   Left Eye Distance:   Bilateral Distance:    Right Eye Near:   Left Eye Near:    Bilateral Near:     Physical Exam Constitutional:      Appearance: Normal appearance.  HENT:     Head: Normocephalic and atraumatic.  Eyes:     Extraocular Movements: Extraocular movements intact.     Conjunctiva/sclera: Conjunctivae normal.  Pulmonary:     Effort: Pulmonary effort is normal.  Musculoskeletal:     Cervical back: Normal.     Thoracic back: Normal.     Lumbar back: Tenderness present. No swelling. Normal range of motion. Positive right straight leg raise test.     Comments: No tenderness to palpation on exam.  Patient states that pain occurs with movement only.  Positive right straight leg raise.  Neurological:     General: No focal deficit present.     Mental Status: He is alert and oriented to person, place, and time. Mental status is at baseline.  Psychiatric:        Mood and Affect: Mood normal.        Behavior: Behavior normal.        Thought Content: Thought content normal.        Judgment: Judgment normal.     UC Treatments / Results  Labs (all labs ordered are listed, but only abnormal results are displayed) Labs Reviewed - No data to display  EKG   Radiology No results found.  Procedures Procedures (including critical care time)  Medications Ordered in UC Medications  ketorolac (TORADOL) 30 MG/ML injection 30 mg (30 mg Intramuscular Given 09/07/20 1303)    Initial Impression / Assessment and Plan / UC Course  I have reviewed the triage vital signs and the nursing notes.  Pertinent  labs & imaging results that were available during my care of  the patient were reviewed by me and considered in my medical decision making (see chart for details).     Physical exam and clinical symptoms consistent with lower back pain with sciatica.  Will treat with 5-day course of prednisone and ketorolac injection in office.  Patient advised to start prednisone tomorrow due to ketorolac injection in office.  Advised patient to try to avoid NSAIDs while taking prednisone.  Alternate ice and heat application.  Advised patient to follow-up if pain worsens or does not improve.  Advised patient to go to the hospital if urinary or bowel incontinence occur.Discussed strict return precautions. Patient verbalized understanding and is agreeable with plan.  Final Clinical Impressions(s) / UC Diagnoses   Final diagnoses:  Acute bilateral low back pain with bilateral sciatica     Discharge Instructions      Start prednisone tomorrow due to receiving ketorolac injection for pain in office today.  Please do not take any anti-inflammatory medications including Celebrex or ibuprofen for 24 hours following ketorolac injection.  Try to avoid taking any additional ibuprofen or NSAID while on steroid.  May take Tylenol as needed.  Please use and alternate ice and heat application to affected area.  Please go to the hospital if you develop urinary incontinence or bowel incontinence.  Please follow-up if pain persist or worsens.       ED Prescriptions     Medication Sig Dispense Auth. Provider   predniSONE (DELTASONE) 20 MG tablet Take 2 tablets (40 mg total) by mouth daily for 5 days. 10 tablet Lance Muss, FNP      PDMP not reviewed this encounter.   Lance Muss, FNP 09/07/20 1309

## 2020-09-07 NOTE — Discharge Instructions (Addendum)
Start prednisone tomorrow due to receiving ketorolac injection for pain in office today.  Please do not take any anti-inflammatory medications including Celebrex or ibuprofen for 24 hours following ketorolac injection.  Try to avoid taking any additional ibuprofen or NSAID while on steroid.  May take Tylenol as needed.  Please use and alternate ice and heat application to affected area.  Please go to the hospital if you develop urinary incontinence or bowel incontinence.  Please follow-up if pain persist or worsens.

## 2020-09-09 ENCOUNTER — Other Ambulatory Visit: Payer: Self-pay

## 2020-09-09 ENCOUNTER — Encounter: Payer: Self-pay | Admitting: Family Medicine

## 2020-09-09 ENCOUNTER — Ambulatory Visit (INDEPENDENT_AMBULATORY_CARE_PROVIDER_SITE_OTHER): Payer: BLUE CROSS/BLUE SHIELD | Admitting: Family Medicine

## 2020-09-09 DIAGNOSIS — M545 Low back pain, unspecified: Secondary | ICD-10-CM | POA: Diagnosis not present

## 2020-09-09 MED ORDER — BACLOFEN 10 MG PO TABS: 5.0000 mg | ORAL_TABLET | Freq: Three times a day (TID) | ORAL | 3 refills | Status: DC | PRN

## 2020-09-09 NOTE — Progress Notes (Signed)
Office Visit Note   Patient: Miguel Frost           Date of Birth: April 10, 1970           MRN: [id number] Visit Date: 09/09/2020 Requested by: Kaleen Mask, MD [address] [place],  [place] [address] PCP: Kaleen Mask, MD  Subjective: Chief Complaint  Patient presents with   Lower Back - Pain    Was in bed from 08/20/20/-08/25/20 due to covid. After that, he has had pain across the lower back with bending or twisting at the waist and with standing up after sitting. Has started having numbness/tingling down the posterior legs (dull pain). No pain with lying flat on his back. On Prednisone (from urgent care) - started yesterday. Had Toradol injection 2 days ago. Taking Tylenol prn.    HPI: He is here with low back pain.  He was sick with COVID-19 from June 17 through June 22.  He was struggling with body aches, and was lying in his bed most of the time.  Toward the end of his illness he started feeling pain in the low back when standing up and moving around.  The pain became severe at 1 point so he went to urgent care recently and was given Toradol injection and oral prednisone.  He took his first dose yesterday and is feeling a little bit better today but still not back to normal.  He has a history of sciatica in the past which responded well to physical therapy with [facility] protocol.  He has done pretty well until now.  This pain feels slightly different, it does not go down just 1 leg but both, and not all the way into the calf.  Denies any numbness, denies any bowel or bladder dysfunction and he has not noticed any weakness.  He does have a history of gout and is on allopurinol and colchicine for that.  Gout has never affected his back.  He currently works for [employer] in [place].  He is formerly a Radiation protection practitioner.               ROS:   All other systems were reviewed and are negative.  Objective: Vital Signs: There were no vitals taken for this visit.  Physical Exam:   General:  Alert and oriented, in no acute distress. Pulm:  Breathing unlabored. Psy:  Normal mood, congruent affect. Skin: No visible rash Low back: He is very tender to palpation near the left SI joint.  He has bilateral "joint mice" near the SI joints.  Straight leg raise negative, no pain with internal hip rotation.  Lower extremity strength and reflexes are normal.    Imaging: No results found.  Assessment & Plan: Acute low back pain, could be due to sacroiliac dysfunction secondary to immobilization.  Cannot rule out recurrent disc protrusion.  Gout attack is a consideration. -He will finish his course of prednisone.  Baclofen as needed.  If not improving next week, he will start physical therapy.  If that does not help, then MRI scan.     Procedures: No procedures performed        PMFS History: There are no problems to display for this patient.  Past Medical History:  Diagnosis Date   Allergy    Gout    Gout     Family History  Problem Relation Age of Onset   Diabetes Mother    Hyperlipidemia Mother    Hypertension Mother    Diabetes Father  Hyperlipidemia Father    Hypertension Father    Heart disease Maternal Grandmother    Stroke Paternal Grandfather    Heart disease Paternal Grandfather     Past Surgical History:  Procedure Laterality Date   CHOLECYSTECTOMY     Social History   Occupational History   Not on file  Tobacco Use   Smoking status: Former    Packs/day: 1.00    Years: 27.00    Pack years: 27.00    Types: Cigarettes    Quit date: 06/22/2020    Years since quitting: 0.2   Smokeless tobacco: Never  Vaping Use   Vaping Use: Every day  Substance and Sexual Activity   Alcohol use: Yes    Alcohol/week: 1.0 standard drink    Types: 1 Standard drinks or equivalent per week    Comment: minimal   Drug use: No   Sexual activity: Not on file

## 2020-11-19 ENCOUNTER — Emergency Department (HOSPITAL_COMMUNITY)
Admission: EM | Admit: 2020-11-19 | Discharge: 2020-11-19 | Disposition: A | Discharge status: Home / Self Care | Payer: BLUE CROSS/BLUE SHIELD | Attending: Emergency Medicine | Admitting: Emergency Medicine

## 2020-11-19 ENCOUNTER — Ambulatory Visit
Admission: EM | Admit: 2020-11-19 | Discharge: 2020-11-19 | Disposition: A | Discharge status: Home / Self Care | Payer: BLUE CROSS/BLUE SHIELD | Attending: Urgent Care | Admitting: Urgent Care

## 2020-11-19 ENCOUNTER — Encounter (HOSPITAL_COMMUNITY): Payer: Self-pay

## 2020-11-19 ENCOUNTER — Ambulatory Visit (INDEPENDENT_AMBULATORY_CARE_PROVIDER_SITE_OTHER): Payer: BLUE CROSS/BLUE SHIELD

## 2020-11-19 ENCOUNTER — Other Ambulatory Visit: Payer: Self-pay

## 2020-11-19 DIAGNOSIS — R059 Cough, unspecified: Secondary | ICD-10-CM

## 2020-11-19 DIAGNOSIS — R079 Chest pain, unspecified: Secondary | ICD-10-CM

## 2020-11-19 DIAGNOSIS — J069 Acute upper respiratory infection, unspecified: Secondary | ICD-10-CM

## 2020-11-19 DIAGNOSIS — R0981 Nasal congestion: Secondary | ICD-10-CM | POA: Insufficient documentation

## 2020-11-19 DIAGNOSIS — J029 Acute pharyngitis, unspecified: Secondary | ICD-10-CM | POA: Diagnosis not present

## 2020-11-19 DIAGNOSIS — R0789 Other chest pain: Secondary | ICD-10-CM

## 2020-11-19 DIAGNOSIS — Z5321 Procedure and treatment not carried out due to patient leaving prior to being seen by health care provider: Secondary | ICD-10-CM | POA: Insufficient documentation

## 2020-11-19 DIAGNOSIS — J439 Emphysema, unspecified: Secondary | ICD-10-CM

## 2020-11-19 HISTORY — DX: Hyperlipidemia, unspecified: E78.5

## 2020-11-19 HISTORY — DX: Emphysema, unspecified: J43.9

## 2020-11-19 MED ORDER — PROMETHAZINE-DM 6.25-15 MG/5ML PO SYRP: 5.0000 mL | ORAL_SOLUTION | Freq: Every evening | ORAL | 0 refills | Status: DC | PRN

## 2020-11-19 MED ORDER — PREDNISONE 20 MG PO TABS: ORAL_TABLET | ORAL | 0 refills | Status: DC

## 2020-11-19 MED ORDER — LEVOCETIRIZINE DIHYDROCHLORIDE 5 MG PO TABS: 5.0000 mg | ORAL_TABLET | Freq: Every evening | ORAL | 0 refills | Status: DC

## 2020-11-19 NOTE — ED Triage Notes (Signed)
Pt c/o cough, sore throat, chills, headache, diarrhea, dyspnea onset 7 days ago.  Denies nausea vomiting, constipation.   States he was at [facility] long ed this morning and not satisfied with the encounter.   States dx w/ early emphysema.

## 2020-11-19 NOTE — ED Triage Notes (Signed)
Pt states for 1 week he has experienced sore throat, cough, chills, and nasal congestion. Pt states on Wednesday he was seen by PCP was tested for COVID and strep and came back neg. Pt states it hurts to swallow, eat and cough. Pt is A&Ox4.

## 2020-11-19 NOTE — ED Provider Notes (Signed)
[facility]   MRN: [id number] DOB: 1971-02-21  Subjective:   Miguel Frost is a 50 y.o. male presenting for 7-day history of acute onset persistent and progressively worsening throat pain, sensation of something stuck in his throat, cough that elicits chest pain.  Cough is dry, has had coughing fits.  Has a history of emphysema.  He is a former smoker, currently vapes.  He is not on any treatments for emphysema.  Does not want to be COVID or flu testing.  States that these tests were negative at home.   Current Facility-Administered Medications:    diphenhydrAMINE (BENADRYL) capsule 50 mg, 50 mg, Oral, Once, Sebastian Ache, MD  Current Outpatient Medications:    allopurinol (ZYLOPRIM) 100 MG tablet, Take 100 mg by mouth daily., Disp: , Rfl:    atorvastatin (LIPITOR) 10 MG tablet, Take 10 mg by mouth daily., Disp: , Rfl:    baclofen (LIORESAL) 10 MG tablet, Take 0.5-1 tablets (5-10 mg total) by mouth 3 (three) times daily as needed for muscle spasms., Disp: 30 each, Rfl: 3   celecoxib (CELEBREX) 200 MG capsule, Take by mouth., Disp: , Rfl:    colchicine 0.6 MG tablet, Take 0.6 mg by mouth daily as needed (gout)., Disp: , Rfl:    Allergies  Allergen Reactions   Contrast Media [Iodinated Diagnostic Agents] Hives   Isovue [Iopamidol] Hives and Itching    Eye swelling, itching, hives   Avelox [Moxifloxacin] Other (See Comments)    Severe headache   Zithromax [Azithromycin] Rash    Past Medical History:  Diagnosis Date   Allergy    Gout    Gout      Past Surgical History:  Procedure Laterality Date   CHOLECYSTECTOMY      Family History  Problem Relation Age of Onset   Diabetes Mother    Hyperlipidemia Mother    Hypertension Mother    Diabetes Father    Hyperlipidemia Father    Hypertension Father    Heart disease Maternal Grandmother    Stroke Paternal Grandfather    Heart disease Paternal Grandfather     Social History   Tobacco Use   Smoking status:  Former    Packs/day: 1.00    Years: 27.00    Pack years: 27.00    Types: Cigarettes    Quit date: 06/22/2020    Years since quitting: 0.4   Smokeless tobacco: Never  Vaping Use   Vaping Use: Every day  Substance Use Topics   Alcohol use: Yes    Alcohol/week: 1.0 standard drink    Types: 1 Standard drinks or equivalent per week    Comment: minimal   Drug use: No    ROS   Objective:   Vitals: BP (!) 136/91 (BP Location: Left Arm)   Pulse 89   Temp 98.4 F (36.9 C) (Oral)   Resp 18   SpO2 97%   Physical Exam Constitutional:      General: He is not in acute distress.    Appearance: Normal appearance. He is well-developed. He is not ill-appearing, toxic-appearing or diaphoretic.  HENT:     Head: Normocephalic and atraumatic.     Right Ear: External ear normal.     Left Ear: External ear normal.     Nose: Nose normal.     Mouth/Throat:     Mouth: Mucous membranes are moist.     Pharynx: No pharyngeal swelling, oropharyngeal exudate, posterior oropharyngeal erythema or uvula swelling.     Tonsils: No  tonsillar exudate or tonsillar abscesses. 0 on the right. 0 on the left.  Eyes:     General: No scleral icterus.       Right eye: No discharge.        Left eye: No discharge.     Extraocular Movements: Extraocular movements intact.     Conjunctiva/sclera: Conjunctivae normal.     Pupils: Pupils are equal, round, and reactive to light.  Cardiovascular:     Rate and Rhythm: Normal rate and regular rhythm.     Heart sounds: Normal heart sounds. No murmur heard.   No friction rub. No gallop.  Pulmonary:     Effort: Pulmonary effort is normal. No respiratory distress.     Breath sounds: Normal breath sounds. No stridor. No wheezing, rhonchi or rales.  Neurological:     Mental Status: He is alert and oriented to person, place, and time.  Psychiatric:        Mood and Affect: Mood normal.        Behavior: Behavior normal.        Thought Content: Thought content normal.         Judgment: Judgment normal.    DG Chest 2 View  Result Date: 11/19/2020 CLINICAL DATA:  Cough and chest pain EXAM: CHEST - 2 VIEW COMPARISON:  06/22/2020 chest CT FINDINGS: Normal heart size and mediastinal contours. No acute infiltrate or edema. No effusion or pneumothorax. No acute osseous findings. Cholecystectomy clips. IMPRESSION: No active cardiopulmonary disease. Electronically Signed   By: Tiburcio Pea M.D.   On: 11/19/2020 08:57     Assessment and Plan :   PDMP not reviewed this encounter.  1. Viral upper respiratory illness   2. Cough   3. Atypical chest pain   4. Pulmonary emphysema, unspecified emphysema type (HCC)     In light of his history of smoking, pulmonary emphysema we will use an oral prednisone.  I do not suspect that his airway is compromised and prednisone will help further.  I suspect that he has a lot of mucus and drainage from his throat, recommended the steroid course as above, Xyzal.  We will use supportive care otherwise.  Patient refused flu and COVID testing. Counseled patient on potential for adverse effects with medications prescribed/recommended today, ER and return-to-clinic precautions discussed, patient verbalized understanding.    Wallis Bamberg, [place] 11/19/20 1610

## 2021-05-24 ENCOUNTER — Ambulatory Visit: Payer: BLUE CROSS/BLUE SHIELD | Admitting: Orthopedic Surgery

## 2021-05-24 ENCOUNTER — Other Ambulatory Visit: Payer: Self-pay

## 2021-05-24 ENCOUNTER — Encounter: Payer: Self-pay | Admitting: Orthopedic Surgery

## 2021-05-24 ENCOUNTER — Ambulatory Visit (INDEPENDENT_AMBULATORY_CARE_PROVIDER_SITE_OTHER): Payer: BLUE CROSS/BLUE SHIELD

## 2021-05-24 VITALS — BP 148/94 | HR 88 | Ht 72.0 in | Wt 240.0 lb

## 2021-05-24 DIAGNOSIS — M7712 Lateral epicondylitis, left elbow: Secondary | ICD-10-CM | POA: Diagnosis not present

## 2021-05-24 DIAGNOSIS — M25522 Pain in left elbow: Secondary | ICD-10-CM

## 2021-05-24 MED ORDER — MELOXICAM 7.5 MG PO TABS: 7.5000 mg | ORAL_TABLET | Freq: Every day | ORAL | 0 refills | Status: DC

## 2021-05-24 NOTE — Progress Notes (Signed)
? ?Office Visit Note ?  ?Patient: Miguel Frost           ?Date of Birth: [birth year]           ?MRN: [id number] ?Visit Date: 05/24/2021 ?             ?Requested by: Kaleen Mask, MD ?[address] ?[place],  [place] [address] ?PCP: Kaleen Mask, MD ? ? ?Assessment & Plan: ?Visit Diagnoses:  ?1. Pain in left elbow   ?2. Lateral epicondylitis, left elbow   ? ? ?Plan: We discussed the diagnosis, prognosis, non-operative and operative treatment options for lateral epicondylitis. ? ?After our discussion, the patient would like to proceed with trying an oral anti-inflammatory and occupational therapy.  We reviewed the risks and benefits of conservative management.  The patient expressed understanding of the reasoning and strategy going forward. ? ?All patient questions and concerns were addressed.  ? ? ?Follow-Up Instructions: No follow-ups on file.  ? ?Orders:  ?Orders Placed This Encounter  ?Procedures  ? XR Elbow 2 Views Left  ? ?No orders of the defined types were placed in this encounter. ? ? ? ? Procedures: ?No procedures performed ? ? ?Clinical Data: ?No additional findings. ? ? ?Subjective: ?Chief Complaint  ?Patient presents with  ? Left Elbow - Pain  ? ? 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  This been going going on for a couple of months now.  His pain is localized to the lateral aspect of the elbow with radiation into the proximal lateral forearm.  He denies any injury.  It is sort of started gradually.  The pain has progressively worsened.  Last week he noticed that he was unable to extend the elbow fully.  He tried a friend's tennis elbow strap with some improvement of his symptoms at first but then they returned.  He has taken ibuprofen inconsistently with moderate pain relief.  He has difficulty at work with turning wrenches and other rotational type activities due to the lateral elbow pain. ? ? ?Review of  Systems ? ? ?Objective: ?Vital Signs: BP (!) 148/94 (BP Location: Left Arm, Patient Position: Sitting)   Pulse 88   Ht 6' (1.829 m)   Wt 240 lb (108.9 kg)   BMI 32.55 kg/m?  ? ?Physical Exam ?Constitutional:   ?   Appearance: Normal appearance.  ?Cardiovascular:  ?   Rate and Rhythm: Normal rate.  ?   Pulses: Normal pulses.  ?Pulmonary:  ?   Effort: Pulmonary effort is normal.  ?Skin: ?   General: Skin is warm.  ?   Capillary Refill: Capillary refill takes less than 2 seconds.  ?Neurological:  ?   Mental Status: He is alert.  ? ? ?Left Elbow Exam  ? ?Tenderness  ?The patient is experiencing tenderness in the lateral epicondyle.  ? ?Range of Motion  ?The patient has normal left elbow ROM. ? ?Other  ?Erythema: absent ?Sensation: normal ?Pulse: present ? ?Comments:  Reproduction of pain w/ resisted middle finger extension with elbow extended.  Mild pain w/ resisted wrist extension.  Near full ROM but lacks 5-10 degrees of full active extension.  ? ? ? ? ?Specialty Comments:  ?No specialty comments available. ? ?Imaging: ?No results found. ? ? ?PMFS History: ?Patient Active Problem List  ? Diagnosis Date Noted  ? Lateral epicondylitis, left elbow 05/24/2021  ? ?Past Medical History:  ?Diagnosis Date  ? Allergy   ? Emphysema lung (  HCC)   ? Gout   ? Gout   ? Hyperlipidemia   ?  ?Family History  ?Problem Relation Age of Onset  ? Diabetes Mother   ? Hyperlipidemia Mother   ? Hypertension Mother   ? Diabetes Father   ? Hyperlipidemia Father   ? Hypertension Father   ? Heart disease Maternal Grandmother   ? Stroke Paternal Grandfather   ? Heart disease Paternal Grandfather   ?  ?Past Surgical History:  ?Procedure Laterality Date  ? CHOLECYSTECTOMY    ? ?Social History  ? ?Occupational History  ? Not on file  ?Tobacco Use  ? Smoking status: Former  ?  Packs/day: 1.00  ?  Years: 27.00  ?  Pack years: 27.00  ?  Types: Cigarettes  ?  Quit date: 06/22/2020  ?  Years since quitting: 0.9  ? Smokeless tobacco: Never  ?Vaping Use   ? Vaping Use: Every day  ?Substance and Sexual Activity  ? Alcohol use: Yes  ?  Alcohol/week: 1.0 standard drink  ?  Types: 1 Standard drinks or equivalent per week  ?  Comment: minimal  ? Drug use: No  ? Sexual activity: Not on file  ? ? ? ? ? ? ?

## 2021-06-20 ENCOUNTER — Other Ambulatory Visit: Payer: Self-pay | Admitting: Orthopedic Surgery

## 2021-07-06 ENCOUNTER — Telehealth: Payer: BLUE CROSS/BLUE SHIELD | Admitting: Physician Assistant

## 2021-07-06 DIAGNOSIS — H109 Unspecified conjunctivitis: Secondary | ICD-10-CM

## 2021-07-06 MED ORDER — POLYMYXIN B-TRIMETHOPRIM 10000-0.1 UNIT/ML-% OP SOLN: OPHTHALMIC | 0 refills | Status: DC

## 2021-07-06 NOTE — Progress Notes (Signed)

## 2021-07-06 NOTE — Progress Notes (Signed)
I have spent 5 minutes in review of e-visit questionnaire, review and updating patient chart, medical decision making and response to patient.   Brielle Moro Cody Shafter Jupin, PA-C    

## 2021-07-08 ENCOUNTER — Telehealth: Payer: BLUE CROSS/BLUE SHIELD | Admitting: Family

## 2021-07-08 DIAGNOSIS — H109 Unspecified conjunctivitis: Secondary | ICD-10-CM

## 2021-07-08 MED ORDER — OFLOXACIN 0.3 % OP SOLN: 1.0000 [drp] | Freq: Four times a day (QID) | OPHTHALMIC | 0 refills | Status: DC

## 2021-07-08 NOTE — Progress Notes (Signed)
E-Visit for Pink Eye ? ? ?We are sorry that you are not feeling well.  Here is how we plan to help! ? ?Based on what you have shared with me it looks like you have conjunctivitis.  Conjunctivitis is a common inflammatory or infectious condition of the eye that is often referred to as "pink eye".  In most cases it is contagious (viral or bacterial). However, not all conjunctivitis requires antibiotics (ex. Allergic).  We have made appropriate suggestions for you based upon your presentation. ? ?I have prescribed Oflaxacin 1-2 drops 4 times a day times 5 days . I also recommend taking Zyrtec 10 mg daily.   ? ?If your symptoms do not improve in the next 24 hours, you need to be seen in person.  ? ?Pink eye can be highly contagious.  It is typically spread through direct contact with secretions, or contaminated objects or surfaces that one may have touched.  Strict handwashing is suggested with soap and water is urged.  If not available, use alcohol based had sanitizer.  Avoid unnecessary touching of the eye.  If you wear contact lenses, you will need to refrain from wearing them until you see no white discharge from the eye for at least 24 hours after being on medication.  You should see symptom improvement in 1-2 days after starting the medication regimen.  Call us if symptoms are not improved in 1-2 days. ? ?Home Care: ?Wash your hands often! ?Do not wear your contacts until you complete your treatment plan. ?Avoid sharing towels, bed linen, personal items with a person who has pink eye. ?See attention for anyone in your home with similar symptoms. ? ?Get Help Right Away If: ?Your symptoms do not improve. ?You develop blurred or loss of vision. ?Your symptoms worsen (increased discharge, pain or redness) ? ? ?Thank you for choosing an e-visit. ? ?Your e-visit answers were reviewed by a board certified advanced clinical practitioner to complete your personal care plan. Depending upon the condition, your plan could  have included both over the counter or prescription medications. ? ?Please review your pharmacy choice. Make sure the pharmacy is open so you can pick up prescription now. If there is a problem, you may contact your provider through [employer] and have the prescription routed to another pharmacy.  Your safety is important to [place]. If you have drug allergies check your prescription carefully.  ? ?For the next 24 hours you can use MyChart to ask questions about today's visit, request a non-urgent call back, or ask for a work or school excuse. ?You will get an email in the next two days asking about your experience. I hope that your e-visit has been valuable and will speed your recovery. ? ?Approximately 5 minutes was spent documenting and reviewing patient's chart.  ? ?

## 2021-08-02 ENCOUNTER — Other Ambulatory Visit: Payer: Self-pay | Admitting: Orthopedic Surgery

## 2021-09-06 ENCOUNTER — Other Ambulatory Visit: Payer: Self-pay | Admitting: Orthopedic Surgery

## 2021-10-12 ENCOUNTER — Other Ambulatory Visit: Payer: Self-pay | Admitting: Orthopedic Surgery

## 2021-11-13 ENCOUNTER — Other Ambulatory Visit: Payer: Self-pay | Admitting: Orthopedic Surgery

## 2022-02-07 ENCOUNTER — Ambulatory Visit: Payer: BLUE CROSS/BLUE SHIELD | Admitting: Cardiology

## 2023-07-30 ENCOUNTER — Ambulatory Visit: Admission: EM | Admit: 2023-07-30 | Discharge: 2023-07-30 | Disposition: A | Discharge status: Home / Self Care

## 2023-07-30 ENCOUNTER — Other Ambulatory Visit: Payer: Self-pay

## 2023-07-30 ENCOUNTER — Encounter: Payer: Self-pay | Admitting: *Deleted

## 2023-07-30 DIAGNOSIS — R110 Nausea: Secondary | ICD-10-CM | POA: Diagnosis not present

## 2023-07-30 DIAGNOSIS — R5383 Other fatigue: Secondary | ICD-10-CM | POA: Diagnosis not present

## 2023-07-30 LAB — POCT FASTING CBG KUC MANUAL ENTRY: POCT Glucose (KUC): 103 mg/dL — AB (ref 70–99)

## 2023-07-30 LAB — POC SARS CORONAVIRUS 2 AG -  ED: SARS Coronavirus 2 Ag: NEGATIVE

## 2023-07-30 MED ORDER — ONDANSETRON 4 MG PO TBDP: 4.0000 mg | ORAL_TABLET | Freq: Three times a day (TID) | ORAL | 0 refills | Status: DC | PRN

## 2023-07-30 NOTE — ED Triage Notes (Signed)
"  I'm sick" - Symptoms x 1 week. Mon and Tues pt had nausea. Vomited Wed morning. Saturday night had subjective fever. Felt "drained" on Sunday with nausea and headache. No respiratory symptoms.

## 2023-07-30 NOTE — ED Provider Notes (Signed)
 [name]    CSN: [id number] Arrival date & time: 07/30/23  1148      History   Chief Complaint Chief Complaint  Patient presents with   Fatigue    HPI Miguel Frost is a 53 y.o. male.   Patient here today for evaluation of feeling generalized fatigue over the last weekend.  He reports that last week he had some nausea and diarrhea and vomited once in the middle of the week.  He reports that he has felt some fatigue since that time.  He has had some mild headache but no respiratory symptoms.  He denies any fever.  He has been drinking plenty of water.  The history is provided by the patient.    Past Medical History:  Diagnosis Date   Allergy    Emphysema lung (HCC)    Gout    Gout    Hyperlipidemia     Patient Active Problem List   Diagnosis Date Noted   Lateral epicondylitis, left elbow 05/24/2021    Past Surgical History:  Procedure Laterality Date   CHOLECYSTECTOMY         Home Medications    Prior to Admission medications   Medication Sig Start Date End Date Taking? Authorizing Provider  cetirizine (ZYRTEC) 10 MG tablet Take 10 mg by mouth daily. 02/09/23  Yes [provider]  omeprazole (PRILOSEC) 20 MG capsule Take 20 mg by mouth daily. 12/10/19  Yes [provider]  ondansetron (ZOFRAN-ODT) 4 MG disintegrating tablet Take 1 tablet (4 mg total) by mouth every 8 (eight) hours as needed. 07/30/23  Yes Vernestine Gondola, PA-C  allopurinol (ZYLOPRIM) 100 MG tablet Take 100 mg by mouth daily.   Yes [provider]  atorvastatin (LIPITOR) 10 MG tablet Take 10 mg by mouth daily. 08/30/20  Yes [provider]  baclofen  (LIORESAL ) 10 MG tablet Take 0.5-1 tablets (5-10 mg total) by mouth 3 (three) times daily as needed for muscle spasms. Patient not taking: Reported on 07/30/2023 09/09/20   Hilts, Michael, MD  celecoxib (CELEBREX) 200 MG capsule Take by mouth. Patient not taking: Reported on 07/30/2023 07/19/20    [provider]  colchicine 0.6 MG tablet Take 0.6 mg by mouth daily as needed (gout).   Yes [provider]  meloxicam  (MOBIC ) 7.5 MG tablet TAKE 1 TABLET BY MOUTH EVERY DAY Patient not taking: Reported on 07/30/2023 10/14/21   Marilyn Shropshire, MD  ofloxacin  (OCUFLOX ) 0.3 % ophthalmic solution Place 1 drop into both eyes 4 (four) times daily. Patient not taking: Reported on 07/30/2023 07/08/21   Yevette Hem, FNP    Family History Family History  Problem Relation Age of Onset   Diabetes Mother    Hyperlipidemia Mother    Hypertension Mother    Diabetes Father    Hyperlipidemia Father    Hypertension Father    Heart disease Maternal Grandmother    Stroke Paternal Grandfather    Heart disease Paternal Grandfather     Social History Social History   Tobacco Use   Smoking status: Former    Current packs/day: 0.00    Average packs/day: 1 pack/day for 27.0 years (27.0 ttl pk-yrs)    Types: Cigarettes    Start date: 06/22/1993    Quit date: 06/22/2020    Years since quitting: 3.1   Smokeless tobacco: Never  Vaping Use   Vaping status: Former  Substance Use Topics   Alcohol use: Yes    Alcohol/week: 1.0 standard drink of  alcohol    Types: 1 Standard drinks or equivalent per week    Comment: minimal   Drug use: No     Allergies   Contrast media [iodinated contrast media], Isovue  [iopamidol ], Avelox [moxifloxacin], and Zithromax [azithromycin]   Review of Systems Review of Systems  Constitutional:  Positive for fatigue. Negative for chills and fever.  Eyes:  Negative for discharge and redness.  Respiratory:  Negative for shortness of breath.   Gastrointestinal:  Positive for diarrhea, nausea and vomiting.  Neurological:  Negative for numbness.     Physical Exam Triage Vital Signs ED Triage Vitals  Encounter Vitals Group     BP 07/30/23 1236 (!) 135/103     Systolic BP Percentile --      Diastolic BP Percentile --      Pulse Rate 07/30/23 1236  76     Resp 07/30/23 1236 16     Temp 07/30/23 1236 98.2 F (36.8 C)     Temp Source 07/30/23 1236 Oral     SpO2 07/30/23 1236 97 %     Weight --      Height --      Head Circumference --      Peak Flow --      Pain Score 07/30/23 1234 0     Pain Loc --      Pain Education --      Exclude from Growth Chart --    No data found.  Updated Vital Signs BP 121/86 (BP Location: Left Arm)   Pulse 76   Temp 98.2 F (36.8 C) (Oral)   Resp 16   SpO2 97%   Visual Acuity Right Eye Distance:   Left Eye Distance:   Bilateral Distance:    Right Eye Near:   Left Eye Near:    Bilateral Near:     Physical Exam Vitals and nursing note reviewed.  Constitutional:      General: He is not in acute distress.    Appearance: Normal appearance. He is not ill-appearing.  HENT:     Head: Normocephalic and atraumatic.  Eyes:     Conjunctiva/sclera: Conjunctivae normal.  Cardiovascular:     Rate and Rhythm: Normal rate.  Pulmonary:     Effort: Pulmonary effort is normal. No respiratory distress.  Neurological:     Mental Status: He is alert.  Psychiatric:        Mood and Affect: Mood normal.        Behavior: Behavior normal.        Thought Content: Thought content normal.      UC Treatments / Results  Labs (all labs ordered are listed, but only abnormal results are displayed) Labs Reviewed  POCT FASTING CBG [facility] MANUAL ENTRY - Abnormal; Notable for the following components:      Result Value   POCT Glucose ([facility]) 103 (*)    All other components within normal limits  POC SARS CORONAVIRUS 2 AG -  ED    EKG   Radiology No results found.  Procedures Procedures (including critical care time)  Medications Ordered in UC Medications - No data to display  Initial Impression / Assessment and Plan / UC Course  I have reviewed the triage vital signs and the nursing notes.  Pertinent labs & imaging results that were available during my care of the patient were reviewed by me and  considered in my medical decision making (see chart for details).    COVID screening negative.  Normal glucose  level.  Will treat with Zofran for mild nausea but unclear etiology of fatigue.  Discussed likely viral etiology and recommended he replenish electrolytes with Gatorade or other electrolyte formula.  Recommended follow-up if no gradual improvement or emergency room with any worsening.  Final Clinical Impressions(s) / UC Diagnoses   Final diagnoses:  Fatigue, unspecified type  Nausea without vomiting   Discharge Instructions   None    ED Prescriptions     Medication Sig Dispense Auth. Provider   ondansetron (ZOFRAN-ODT) 4 MG disintegrating tablet Take 1 tablet (4 mg total) by mouth every 8 (eight) hours as needed. 20 tablet Vernestine Gondola, PA-C      PDMP not reviewed this encounter.   Vernestine Gondola, PA-C 07/30/23 1454

## 2023-08-08 ENCOUNTER — Other Ambulatory Visit: Payer: Self-pay | Admitting: Family Medicine

## 2023-08-08 DIAGNOSIS — F17200 Nicotine dependence, unspecified, uncomplicated: Secondary | ICD-10-CM

## 2023-08-20 ENCOUNTER — Ambulatory Visit
Admission: RE | Admit: 2023-08-20 | Discharge: 2023-08-20 | Disposition: A | Discharge status: Home / Self Care | Source: Ambulatory Visit | Attending: Family Medicine | Admitting: Family Medicine

## 2023-08-20 DIAGNOSIS — F17200 Nicotine dependence, unspecified, uncomplicated: Secondary | ICD-10-CM

## 2024-03-27 ENCOUNTER — Other Ambulatory Visit: Payer: Self-pay

## 2024-03-27 ENCOUNTER — Encounter (HOSPITAL_COMMUNITY): Payer: Self-pay | Admitting: Emergency Medicine

## 2024-03-27 ENCOUNTER — Emergency Department (HOSPITAL_COMMUNITY)
Admission: EM | Admit: 2024-03-27 | Discharge: 2024-03-27 | Disposition: A | Discharge status: Home / Self Care | Attending: Emergency Medicine | Admitting: Emergency Medicine

## 2024-03-27 DIAGNOSIS — R42 Dizziness and giddiness: Secondary | ICD-10-CM | POA: Diagnosis not present

## 2024-03-27 DIAGNOSIS — R55 Syncope and collapse: Secondary | ICD-10-CM | POA: Diagnosis present

## 2024-03-27 LAB — COMPREHENSIVE METABOLIC PANEL WITH GFR
ALT: 57 U/L — ABNORMAL HIGH (ref 0–44)
AST: 41 U/L (ref 15–41)
Albumin: 4.5 g/dL (ref 3.5–5.0)
Alkaline Phosphatase: 77 U/L (ref 38–126)
Anion gap: 11 (ref 5–15)
BUN: 16 mg/dL (ref 6–20)
CO2: 23 mmol/L (ref 22–32)
Calcium: 9.9 mg/dL (ref 8.9–10.3)
Chloride: 106 mmol/L (ref 98–111)
Creatinine, Ser: 1.19 mg/dL (ref 0.61–1.24)
GFR, Estimated: >60 mL/min
Glucose, Bld: 99 mg/dL (ref 70–99)
Potassium: 3.7 mmol/L (ref 3.5–5.1)
Sodium: 141 mmol/L (ref 135–145)
Total Bilirubin: 0.7 mg/dL (ref 0.0–1.2)
Total Protein: 8.1 g/dL (ref 6.5–8.1)

## 2024-03-27 LAB — URINALYSIS, ROUTINE W REFLEX MICROSCOPIC
Bilirubin Urine: NEGATIVE
Glucose, UA: NEGATIVE mg/dL
Hgb urine dipstick: NEGATIVE
Ketones, ur: NEGATIVE mg/dL
Leukocytes,Ua: NEGATIVE
Nitrite: NEGATIVE
Protein, ur: NEGATIVE mg/dL
Specific Gravity, Urine: 1.021 (ref 1.005–1.030)
pH: 5 (ref 5.0–8.0)

## 2024-03-27 LAB — CBC
HCT: 40.2 % (ref 39.0–52.0)
Hemoglobin: 14.7 g/dL (ref 13.0–17.0)
MCH: 32.2 pg (ref 26.0–34.0)
MCHC: 36.6 g/dL — ABNORMAL HIGH (ref 30.0–36.0)
MCV: 88 fL (ref 80.0–100.0)
Platelets: 182 K/uL (ref 150–400)
RBC: 4.57 MIL/uL (ref 4.22–5.81)
RDW: 12.4 % (ref 11.5–15.5)
WBC: 8.1 K/uL (ref 4.0–10.5)
nRBC: 0 % (ref 0.0–0.2)

## 2024-03-27 LAB — CBG MONITORING, ED: Glucose-Capillary: 86 mg/dL (ref 70–99)

## 2024-03-27 LAB — TROPONIN T, HIGH SENSITIVITY: Troponin T High Sensitivity: 6 ng/L (ref 0–19)

## 2024-03-27 NOTE — Discharge Instructions (Signed)
 Fortunately no concerning findings were noted on today's exam.  Our cardiology office will reach out to you for an outpatient follow-up for further evaluation of your near syncopal episode.

## 2024-03-27 NOTE — ED Provider Triage Note (Signed)
 Emergency Medicine Provider Triage Evaluation Note  Rodgerick Gilliand , a 54 y.o. male  was evaluated in triage.  Pt complains of near syncope.  History of orthostatic symptoms for some time.  Wife reports that on Monday he was getting dizzy every time he stood up. Patient was at work today and was not moving was just standing and into his boss when his near-syncope symptoms became very severe.  He started to slide down he lost control of his legs and slid himself down to the floor.  He did not fully lose consciousness and could hear and stay awake but became extremely dizzy.  He was given some water by his boss and he drink a protein shake.  He states that after that he became extremely jittery.  He tried eating some skittles to bring his blood sugar up.  He came in for further evaluation.  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  Review of Systems  Positive: Near syncope Negative: Fever  Physical Exam  BP (!) 156/117 (BP Location: Right Arm)   Pulse 92   Temp 98.7 F (37.1 C) (Oral)   Resp 18   SpO2 98%  Gen:   Awake, no distress   Resp:  Normal effort  MSK:   Moves extremities without difficulty  Other:    Medical Decision Making  Medically screening exam initiated at 7:17 PM.  Appropriate orders placed.  Jivan Symanski was informed that the remainder of the evaluation will be completed by another provider, this initial triage assessment does not replace that evaluation, and the importance of remaining in the ED until their evaluation is complete.     Arloa Chroman, PA-C 03/30/24 1624

## 2024-03-27 NOTE — ED Provider Notes (Signed)
 " Miguel Frost EMERGENCY DEPARTMENT AT [facility] Provider Note   CSN: [id number] Arrival date & time: 03/27/24  1743     Patient presents with: Near Syncope   Miguel Frost is a 54 y.o. male.   The history is provided by the patient and medical records. No language interpreter was used.  Near Syncope     54 year old male presenting for evaluation of lightheadedness.  Patient states today while at work he was standing and talking to his boss when he felt lightheadedness.  He felt like he was going to pass out and then his leg gave out on him and he got himself down to the ground.  He denies hitting his head or loss of consciousness.  He felt jittery and shaky for a bit but was able to get up on his own and went to get something to drink and something to eat.  His symptoms did improved.  However, given his presentation he decided to come to the ER for further evaluation.  He mention he has several similar near syncopal episodes in the past usually caused by standing up too quickly but this time it happened spontaneously.  He did not endorse any significant headache, confusion, nausea, diaphoresis, chest pain, trouble breathing, abdominal pain no back pain.  He normally eats once daily so this is not unusual for him.  He denies any recent medication changes and he denies alcohol or tobacco abuse.  At this moment he feels fine.  Prior to Admission medications  Medication Sig Start Date End Date Taking? Authorizing Provider  allopurinol (ZYLOPRIM) 100 MG tablet Take 100 mg by mouth daily.    [provider]  atorvastatin (LIPITOR) 10 MG tablet Take 10 mg by mouth daily. 08/30/20   [provider]  baclofen  (LIORESAL ) 10 MG tablet Take 0.5-1 tablets (5-10 mg total) by mouth 3 (three) times daily as needed for muscle spasms. Patient not taking: Reported on 07/30/2023 09/09/20   Hilts, Michael, MD  celecoxib (CELEBREX) 200 MG capsule Take by mouth. Patient not taking:  Reported on 07/30/2023 07/19/20   [provider]  cetirizine (ZYRTEC) 10 MG tablet Take 10 mg by mouth daily. 02/09/23   [provider]  colchicine 0.6 MG tablet Take 0.6 mg by mouth daily as needed (gout).    [provider]  meloxicam  (MOBIC ) 7.5 MG tablet TAKE 1 TABLET BY MOUTH EVERY DAY Patient not taking: Reported on 07/30/2023 10/14/21   Romona Harari, MD  ofloxacin  (OCUFLOX ) 0.3 % ophthalmic solution Place 1 drop into both eyes 4 (four) times daily. Patient not taking: Reported on 07/30/2023 07/08/21   Lavell Bari LABOR, FNP  omeprazole (PRILOSEC) 20 MG capsule Take 20 mg by mouth daily. 12/10/19   [provider]  ondansetron  (ZOFRAN -ODT) 4 MG disintegrating tablet Take 1 tablet (4 mg total) by mouth every 8 (eight) hours as needed. 07/30/23   Billy Asberry FALCON, PA-C    Allergies: Contrast media [iodinated contrast media], Isovue  [iopamidol ], Avelox [moxifloxacin], and Zithromax [azithromycin]    Review of Systems  Cardiovascular:  Positive for near-syncope.  All other systems reviewed and are negative.   Updated Vital Signs BP (!) 156/117 (BP Location: Right Arm)   Pulse 92   Temp 98.7 F (37.1 C) (Oral)   Resp 18   SpO2 98%   Physical Exam Constitutional:      General: He is not in acute distress.    Appearance: He is well-developed.  HENT:  Head: Atraumatic.  Eyes:     Conjunctiva/sclera: Conjunctivae normal.  Neck:     Comments: No carotid bruit Cardiovascular:     Rate and Rhythm: Normal rate and regular rhythm.     Pulses: Normal pulses.     Heart sounds: Normal heart sounds.  Pulmonary:     Effort: Pulmonary effort is normal.     Breath sounds: Normal breath sounds.  Abdominal:     Palpations: Abdomen is soft.     Tenderness: There is no abdominal tenderness.  Musculoskeletal:        General: Normal range of motion.     Cervical back: Normal range of motion and neck supple.  Skin:    Capillary Refill: Capillary refill  takes less than 2 seconds.     Findings: No rash.  Neurological:     Mental Status: He is alert and oriented to person, place, and time.     Cranial Nerves: No cranial nerve deficit.     Sensory: No sensory deficit.     Motor: No weakness.     Coordination: Coordination normal.     Gait: Gait normal.     Deep Tendon Reflexes: Reflexes normal.  Psychiatric:        Mood and Affect: Mood normal.     (all labs ordered are listed, but only abnormal results are displayed) Labs Reviewed  COMPREHENSIVE METABOLIC PANEL WITH GFR - Abnormal; Notable for the following components:      Result Value   ALT 57 (*)    All other components within normal limits  CBC - Abnormal; Notable for the following components:   MCHC 36.6 (*)    All other components within normal limits  URINALYSIS, ROUTINE W REFLEX MICROSCOPIC - Abnormal; Notable for the following components:   Color, Urine AMBER (*)    APPearance HAZY (*)    All other components within normal limits  CBG MONITORING, ED  TROPONIN T, HIGH SENSITIVITY    EKG: None  Date: 03/27/2024  Rate: 93  Rhythm: normal sinus rhythm  QRS Axis: normal  Intervals: normal  ST/T Wave abnormalities: normal  Conduction Disutrbances: none  Narrative Interpretation:   Old EKG Reviewed: No significant changes noted    Radiology: No results found.   Procedures   Medications Ordered in the ED - No data to display                                  Medical Decision Making Amount and/or Complexity of Data Reviewed Labs: ordered.   BP (!) 156/117 (BP Location: Right Arm)   Pulse 92   Temp 98.7 F (37.1 C) (Oral)   Resp 18   SpO2 98%   25:82 PM  54 year old male presenting for evaluation of lightheadedness.  Patient states today while at work he was standing and talking to his boss when he felt lightheadedness.  He felt like he was going to pass out and then his leg gave out on him and he got himself down to the ground.  He denies hitting his  head or loss of consciousness.  He felt jittery and shaky for a bit but was able to get up on his own and went to get something to drink and something to eat.  His symptoms did improved.  However, given his presentation he decided to come to the ER for further evaluation.  He mention he has several similar near  syncopal episodes in the past usually caused by standing up too quickly but this time it happened spontaneously.  He did not endorse any significant headache, confusion, nausea, diaphoresis, chest pain, trouble breathing, abdominal pain no back pain.  He normally eats once daily so this is not unusual for him.  He denies any recent medication changes and he denies alcohol or tobacco abuse.  At this moment he feels fine.  On exam patient is resting comfortably in no acute discomfort.  He is alert and oriented x 4.  Strength equal throughout.  Heart with normal rate rhythm, lungs are clear abdomen soft nontender, no focal neurodeficit  Vital signs show slightly elevated blood pressure of 156/117.  Patient does not have any history of hypertension.  Patient is afebrile, no hypoxia.  -Labs ordered, independently viewed and interpreted by me.  Labs remarkable for normal CBG, UA without signs of UTI, electrolyte panels are reassuring, normal WBC, normal H&H, normal troponin -The patient was maintained on a cardiac monitor.  I personally viewed and interpreted the cardiac monitored which showed an underlying rhythm of: SR -Imaging including head CT considered but no head injury, therefore low yield -This patient presents to the ED for concern of near syncope, this involves an extensive number of treatment options, and is a complaint that carries with it a high risk of complications and morbidity.  The differential diagnosis includes cardiac arrhythmia, vasovagal, dehydration, anemia, metabolic derangement, stroke, MI -Co morbidities that complicate the patient evaluation includes none -Treatment includes  monitoring -Reevaluation of the patient after these medicines showed that the patient improved -PCP office notes or outside notes reviewed -Escalation to admission/observation considered: patients feels much better, is comfortable with discharge, and will follow up with cardiology -Prescription medication considered, patient comfortable with home meds -Social Determinant of Health considered which includes remote hx of tobacco use.       Final diagnoses:  Near syncope    ED Discharge Orders          Ordered    Ambulatory referral to Cardiology       Comments: If you have not heard from the Cardiology office within the next 72 hours please call [phone].   03/27/24 2024               Nivia Colon, PA-C 03/27/24 2024  "

## 2024-03-27 NOTE — ED Triage Notes (Signed)
 Pt presents after syncopal episode at work.  Pt states sometimes when he stands quickly he feels dizzy.  Today he was talking with coworkers and had a near syncopal episode.  Did not lose consciousness.  Has not felt right, feels anxious.  Had not eaten today.  Ate a bag of skittles but still does not feel right No frank chest pain. No shob or n/v/d.

## 2024-04-02 ENCOUNTER — Ambulatory Visit: Attending: Cardiology

## 2024-04-02 ENCOUNTER — Ambulatory Visit

## 2024-04-02 VITALS — BP 159/103 | HR 77 | Ht 72.0 in | Wt 242.2 lb

## 2024-04-02 DIAGNOSIS — Z87891 Personal history of nicotine dependence: Secondary | ICD-10-CM

## 2024-04-02 DIAGNOSIS — R55 Syncope and collapse: Secondary | ICD-10-CM | POA: Diagnosis not present

## 2024-04-02 DIAGNOSIS — K760 Fatty (change of) liver, not elsewhere classified: Secondary | ICD-10-CM

## 2024-04-02 DIAGNOSIS — I951 Orthostatic hypotension: Secondary | ICD-10-CM | POA: Diagnosis not present

## 2024-04-02 DIAGNOSIS — E785 Hyperlipidemia, unspecified: Secondary | ICD-10-CM

## 2024-04-02 DIAGNOSIS — R002 Palpitations: Secondary | ICD-10-CM

## 2024-04-02 DIAGNOSIS — J439 Emphysema, unspecified: Secondary | ICD-10-CM

## 2024-04-02 MED ORDER — OMRON 3 SERIES BP MONITOR DEVI: 1.0000 | Freq: Two times a day (BID) | 0 refills | Status: AC

## 2024-04-02 NOTE — Progress Notes (Signed)
 "     Cardiology Office Note Date:  04/02/2024  ID:  Miguel Frost, DOB 1971/01/17, MRN [id number] PCP:  Loring Tanda Mae, MD  Cardiologist:  Joelle VEAR Ren Donley, MD  Chief Complaint  Patient presents with   Near Syncope     Problems Near syncope Emphysema Hepatic steatosis Former tobacco use 27 pack year (Quit 2022)  Visits  ED: Near syncope w/ orthostasis; has had recurrence 1/26: EM, TTE, CAC, BP cuff    Discussed the use of AI scribe software for clinical note transcription with the patient, who gave verbal consent to proceed.  History of Present Illness Miguel Frost is a 54 year old male who presents with episodes of lightheadedness and palpitations. He experienced his first episode of lightheadedness and palpitations last Thursday while at work, during which he felt lightheaded and fell to the floor without losing consciousness. Post-episode, he felt jittery and unwell, prompting a visit to [facility] where he was found to have high blood pressure, atypical for him as his usual readings are around 120/80 mmHg. Since the initial episode, he has experienced similar sensations once or twice a day, though they are brief. He describes a 'weird feeling' in his chest, which he assumes might be palpitations, and occasionally experiences dizziness. These symptoms sometimes occur together but not always. No chest pain, shortness of breath, or other significant health changes have been noted recently. He has a history of smoking a pack a day for nearly 30 years, having quit almost four years ago. He was diagnosed with mild emphysema at age 58 during a pre-cancer screening. He no longer experiences annual bronchitis. He has never been on medication for high blood pressure. His family history includes his mother having atrial fibrillation, his father being diagnosed with amyloidosis late in life, and his grandfather having carotid artery issues. There is no known family history of  early-onset heart disease. He mentions a history of random premature ventricular contractions (PVCs) and has been a paramedic for 12 years, which gives him some familiarity with cardiac symptoms. He does not currently take Lipitor, as it was discontinued due to elevated liver enzymes, which he attributes to frequent use of Motrin  for musculoskeletal pain.    ROS: Please see the history of present illness. All other systems are reviewed and negative.   Past Medical History:  Diagnosis Date   Allergy    Emphysema lung (HCC)    Gout    Gout    Hyperlipidemia     Past Surgical History:  Procedure Laterality Date   CHOLECYSTECTOMY      Current Outpatient Medications  Medication Sig Dispense Refill   allopurinol (ZYLOPRIM) 100 MG tablet Take 100 mg by mouth Frost.     atorvastatin (LIPITOR) 10 MG tablet Take 10 mg by mouth Frost.     cetirizine (ZYRTEC) 10 MG tablet Take 10 mg by mouth Frost.     colchicine 0.6 MG tablet Take 0.6 mg by mouth Frost as needed (gout).     omeprazole (PRILOSEC) 20 MG capsule Take 20 mg by mouth Frost.     ondansetron  (ZOFRAN ) 8 MG tablet Take by mouth as needed for nausea or vomiting.     Current Facility-Administered Medications  Medication Dose Route Frequency Provider Last Rate Last Admin   diphenhydrAMINE  (BENADRYL ) capsule 50 mg  50 mg Oral Once Grady, Allen, MD        Allergies:   Contrast media [iodinated contrast media], Isovue  [iopamidol ], Avelox [moxifloxacin], and Zithromax [azithromycin]  Social History:  see above  Family History:  see above  PHYSICAL EXAM: VS:  BP (!) 159/103 (BP Location: Left Arm)   Pulse 77   Ht 6' (1.829 m)   Wt 242 lb 3.2 oz (109.9 kg)   SpO2 97%   BMI 32.85 kg/m  , BMI Body mass index is 32.85 kg/m. GEN: Well nourished, well developed, in no acute distress HEENT: normal Neck: no JVD, carotid bruits, or masses Cardiac: RRR; no murmurs, rubs, or gallops,no edema  Respiratory:  CTAB bilaterally, normal  work of breathing GI: soft, nontender, nondistended, + BS Extremities: No LE edema Skin: warm and dry, no rash Neuro:  Strength and sensation are intact   Recent Labs: Reviewed  Studies: Reviewed  Assessment and Plan Assessment & Plan Syncope and palpitations Intermittent dizziness and near syncope with palpitations. Differential includes arrhythmia or cardiac causes. - Ordered 14-day cardiac monitor to assess for arrhythmias. - Ordered echocardiogram to evaluate cardiac function.  Hypertension Recent elevated blood pressure during syncopal event. Typically normal readings. No prior antihypertensive use. - Provided blood pressure cuff for home monitoring. - Instructed to check blood pressure every morning for two weeks and log results. - Will consider antihypertensive medication if home readings are consistently elevated.  Hyperlipidemia Previously on Lipitor, discontinued due to elevated liver enzymes. Plan to assess cardiovascular risk with calcium score. - Ordered calcium score to assess coronary artery disease risk. - Will reassess need for statin therapy based on calcium score results.   Signed, Joelle VEAR Ren Donley, MD  04/02/2024 8:19 AM    [facility] "

## 2024-04-02 NOTE — Progress Notes (Unsigned)
 Applied a 14 day Zio XT monitor to patient in the office ?

## 2024-04-02 NOTE — Patient Instructions (Addendum)
 Medication Instructions:  Your physician recommends that you continue on your current medications as directed. Please refer to the Current Medication list given to you today.  *If you need a refill on your cardiac medications before your next appointment, please call your pharmacy*  Lab Work: Lab Orders  No laboratory test(s) ordered today    If you have labs (blood work) drawn today and your tests are completely normal, you will receive your results only by: MyChart Message (if you have MyChart) OR A paper copy in the mail If you have any lab test that is abnormal or we need to change your treatment, we will call you to review the results.  Testing/Procedures: ECHOCARDIOGRAM (before 06/03/24 appointment)  Your physician has requested that you have an echocardiogram. Echocardiography is a painless test that uses sound waves to create images of your heart. It provides your doctor with information about the size and shape of your heart and how well your hearts chambers and valves are working. This procedure takes approximately one hour. There are no restrictions for this procedure. Please do NOT wear cologne, perfume, aftershave, or lotions (deodorant is allowed). Please arrive 15 minutes prior to your appointment time.  Please note: We ask at that you not bring children with you during ultrasound (echo/ vascular) testing. Due to room size and safety concerns, children are not allowed in the ultrasound rooms during exams. Our front office staff cannot provide observation of children in our lobby area while testing is being conducted. An adult accompanying a patient to their appointment will only be allowed in the ultrasound room at the discretion of the ultrasound technician under special circumstances. We apologize for any inconvenience.   CALCIUM SCORING (before 06/03/24 appointment)  Your provider would like for you to have a Calcium Score CT. This test is painless. This is a non-contrast CT  of the heat to look for calcified lesions in the coronary arteries. The cost of this is test cost $99 out of pocket and is not submitted to your insurance. The test can be performed at our [facility] Location in [place].  You can get the test scheduled in our office or you can call [phone].  Then press option 4, Then press option 2 Finally press option 2 and get your test scheduled.    Follow-Up: At [facility], you and your health needs are our priority.  As part of our continuing mission to provide you with exceptional heart care, our providers are all part of one team.  This team includes your primary Cardiologist (physician) and Advanced Practice Providers or APPs (Physician Assistants and Nurse Practitioners) who all work together to provide you with the care you need, when you need it.  ZIO HEART MONITOR (14 DAYS) Placed on patient in-clinic  Your next appointment:   Tuesday, June 03, 2024 @ 8am  Provider:   Joelle VEAR Ren Donley, MD   Other Instructions Blood Pressure Record Sheet To take your blood pressure, you will need a blood pressure machine. You can buy a blood pressure machine (blood pressure monitor) at your clinic, drug store, or online. When choosing one, consider: An automatic monitor that has an arm cuff. A cuff that wraps snugly around your upper arm. You should be able to fit only one finger between your arm and the cuff. A device that stores blood pressure reading results. Do not choose a monitor that measures your blood pressure from your wrist or finger. Follow your health care  provider's instructions for how to take your blood pressure. To use this form: Take your blood pressure medications every day These measurements should be taken when you have been at rest for at least 10-15 min Take at least 2 readings with each blood pressure check. This makes sure the results are correct. Wait 1-2 minutes between measurements. Write  down the results in the spaces on this form. Keep in mind it should always be recorded systolic over diastolic. Both numbers are important.  Repeat this every day for 2-3 weeks, or as told by your health care provider.  Make a follow-up appointment with your health care provider to discuss the results.  Blood Pressure Log Date Medications taken? (Y/N) Blood Pressure Time of Day                                                                                                        We recommend signing up for the patient portal called MyChart.  Sign up information is provided on this After Visit Summary.  MyChart is used to connect with patients for Virtual Visits (Telemedicine).  Patients are able to view lab/test results, encounter notes, upcoming appointments, etc.  Non-urgent messages can be sent to your provider as well.   To learn more about what you can do with MyChart, go to [email or web address].

## 2024-05-14 ENCOUNTER — Ambulatory Visit (HOSPITAL_COMMUNITY)

## 2024-05-14 ENCOUNTER — Other Ambulatory Visit (HOSPITAL_COMMUNITY)

## 2024-06-03 ENCOUNTER — Ambulatory Visit
